# Patient Record
Sex: Female | Born: 1980 | Hispanic: Yes | Marital: Single | Smoking: Never smoker
Health system: Southern US, Community
[De-identification: ages and names within clinical notes are randomized; demographics above are authoritative.]

## PROBLEM LIST (undated history)

## (undated) ENCOUNTER — Inpatient Hospital Stay (HOSPITAL_COMMUNITY): Payer: Self-pay

## (undated) DIAGNOSIS — Z789 Other specified health status: Secondary | ICD-10-CM

## (undated) DIAGNOSIS — K219 Gastro-esophageal reflux disease without esophagitis: Secondary | ICD-10-CM

## (undated) DIAGNOSIS — Z8759 Personal history of other complications of pregnancy, childbirth and the puerperium: Secondary | ICD-10-CM

## (undated) DIAGNOSIS — Z8719 Personal history of other diseases of the digestive system: Secondary | ICD-10-CM

## (undated) HISTORY — DX: Personal history of other complications of pregnancy, childbirth and the puerperium: Z87.59

## (undated) HISTORY — DX: Personal history of other diseases of the digestive system: Z87.19

## (undated) HISTORY — PX: APPENDECTOMY: SHX54

---

## 2005-11-12 ENCOUNTER — Ambulatory Visit: Payer: Self-pay | Admitting: Gynecology

## 2005-11-13 ENCOUNTER — Ambulatory Visit (HOSPITAL_COMMUNITY): Admission: RE | Admit: 2005-11-13 | Discharge: 2005-11-13 | Payer: Self-pay | Admitting: *Deleted

## 2005-11-19 ENCOUNTER — Ambulatory Visit: Payer: Self-pay | Admitting: Obstetrics & Gynecology

## 2005-11-21 ENCOUNTER — Ambulatory Visit (HOSPITAL_COMMUNITY): Admission: RE | Admit: 2005-11-21 | Discharge: 2005-11-21 | Payer: Self-pay | Admitting: Obstetrics & Gynecology

## 2005-11-21 ENCOUNTER — Ambulatory Visit: Payer: Self-pay | Admitting: Obstetrics & Gynecology

## 2005-11-26 ENCOUNTER — Ambulatory Visit: Payer: Self-pay | Admitting: Obstetrics & Gynecology

## 2005-11-28 ENCOUNTER — Ambulatory Visit: Payer: Self-pay | Admitting: Obstetrics & Gynecology

## 2005-12-01 ENCOUNTER — Ambulatory Visit: Payer: Self-pay | Admitting: Obstetrics & Gynecology

## 2005-12-04 ENCOUNTER — Ambulatory Visit: Payer: Self-pay | Admitting: Gynecology

## 2005-12-08 ENCOUNTER — Ambulatory Visit: Payer: Self-pay | Admitting: Obstetrics & Gynecology

## 2005-12-11 ENCOUNTER — Ambulatory Visit: Payer: Self-pay | Admitting: Gynecology

## 2005-12-14 ENCOUNTER — Inpatient Hospital Stay (HOSPITAL_COMMUNITY): Admission: AD | Admit: 2005-12-14 | Discharge: 2005-12-17 | Payer: Self-pay | Admitting: Family Medicine

## 2005-12-14 ENCOUNTER — Ambulatory Visit: Payer: Self-pay | Admitting: Obstetrics and Gynecology

## 2013-06-30 HISTORY — PX: CHOLECYSTECTOMY: SHX55

## 2015-07-27 LAB — CYTOLOGY - PAP: Pap: NEGATIVE

## 2015-10-30 ENCOUNTER — Encounter (HOSPITAL_COMMUNITY): Payer: Self-pay | Admitting: Advanced Practice Midwife

## 2015-10-30 ENCOUNTER — Inpatient Hospital Stay (HOSPITAL_COMMUNITY)
Admission: AD | Admit: 2015-10-30 | Discharge: 2015-10-30 | Disposition: A | Discharge status: Home / Self Care | Payer: Self-pay | Source: Ambulatory Visit | Attending: Family Medicine | Admitting: Family Medicine

## 2015-10-30 ENCOUNTER — Inpatient Hospital Stay (HOSPITAL_COMMUNITY): Payer: Self-pay

## 2015-10-30 DIAGNOSIS — O26899 Other specified pregnancy related conditions, unspecified trimester: Secondary | ICD-10-CM

## 2015-10-30 DIAGNOSIS — O4691 Antepartum hemorrhage, unspecified, first trimester: Secondary | ICD-10-CM

## 2015-10-30 DIAGNOSIS — O209 Hemorrhage in early pregnancy, unspecified: Secondary | ICD-10-CM | POA: Insufficient documentation

## 2015-10-30 DIAGNOSIS — O9989 Other specified diseases and conditions complicating pregnancy, childbirth and the puerperium: Secondary | ICD-10-CM

## 2015-10-30 DIAGNOSIS — Z3A09 9 weeks gestation of pregnancy: Secondary | ICD-10-CM | POA: Insufficient documentation

## 2015-10-30 DIAGNOSIS — R109 Unspecified abdominal pain: Secondary | ICD-10-CM

## 2015-10-30 HISTORY — DX: Other specified health status: Z78.9

## 2015-10-30 LAB — URINALYSIS, ROUTINE W REFLEX MICROSCOPIC
BILIRUBIN URINE: NEGATIVE
GLUCOSE, UA: NEGATIVE mg/dL
KETONES UR: NEGATIVE mg/dL
Leukocytes, UA: NEGATIVE
Nitrite: NEGATIVE
PROTEIN: NEGATIVE mg/dL
Specific Gravity, Urine: <1.005 — ABNORMAL LOW (ref 1.005–1.030)
pH: 6 (ref 5.0–8.0)

## 2015-10-30 LAB — CBC
HCT: 38 % (ref 36.0–46.0)
Hemoglobin: 12.8 g/dL (ref 12.0–15.0)
MCH: 27.6 pg (ref 26.0–34.0)
MCHC: 33.7 g/dL (ref 30.0–36.0)
MCV: 81.9 fL (ref 78.0–100.0)
PLATELETS: 357 10*3/uL (ref 150–400)
RBC: 4.64 MIL/uL (ref 3.87–5.11)
RDW: 13.4 % (ref 11.5–15.5)
WBC: 9.4 10*3/uL (ref 4.0–10.5)

## 2015-10-30 LAB — WET PREP, GENITAL
Clue Cells Wet Prep HPF POC: NONE SEEN
Sperm: NONE SEEN
Trich, Wet Prep: NONE SEEN
YEAST WET PREP: NONE SEEN

## 2015-10-30 LAB — HCG, QUANTITATIVE, PREGNANCY: hCG, Beta Chain, Quant, S: 19598 m[IU]/mL — ABNORMAL HIGH (ref <5)

## 2015-10-30 LAB — URINE MICROSCOPIC-ADD ON: WBC UA: NONE SEEN WBC/hpf (ref 0–5)

## 2015-10-30 LAB — ABO/RH: ABO/RH(D): O POS

## 2015-10-30 LAB — POCT PREGNANCY, URINE: PREG TEST UR: POSITIVE — AB

## 2015-10-30 NOTE — MAU Note (Signed)
Had some mucous d/c mixed with blood, first noted on Friday. Last night she started having bleeding.this morning, her hips started hurting

## 2015-10-30 NOTE — MAU Provider Note (Signed)
History     CSN: [id number]  Arrival date and time: 10/30/15 1503   None     Chief Complaint  Patient presents with  . Hip Pain  . Vaginal Bleeding   HPI Pt is [redacted]w[redacted]d pregnant G1P0 presents with bleeding in pregnancy.  Pt had blood tinged mucous discharge yesterday and then had bleeding last night and this morning with hips hurting, like when she has a period. Pt has not taken anything for the pain.  Pt denies other abnormal discharge, abd cramping, nausea or vomiting, constipation, diarrhea or UTI sx. RN note:      Expand All Collapse All   Had some mucous d/c mixed with blood, first noted on Friday. Last night she started having bleeding.this morning, her hips started hurting       No past medical history on file.  No past surgical history on file.  No family history on file.  Social History  Substance Use Topics  . Smoking status: Not on file  . Smokeless tobacco: Not on file  . Alcohol Use: Not on file    Allergies: Allergies not on file  No prescriptions prior to admission    Review of Systems  Constitutional: Negative for fever and chills.  Gastrointestinal: Negative for nausea, vomiting, abdominal pain, diarrhea and constipation.  Genitourinary: Negative for dysuria.   Physical Exam   Blood pressure 106/73, pulse 69, temperature 98.2 F (36.8 C), temperature source Oral, resp. rate 18, height 5\' 4"  (1.626 m), weight 166 lb 9.6 oz (75.569 kg), last menstrual period 08/24/2015.  Physical Exam  Nursing note and vitals reviewed. Constitutional: She is oriented to person, place, and time. She appears well-developed and well-nourished. No distress.  HENT:  Head: Normocephalic.  Eyes: Pupils are equal, round, and reactive to light.  Neck: Normal range of motion. Neck supple.  Respiratory: Effort normal.  GI: Soft. She exhibits no distension. There is no tenderness. There is no rebound.  Genitourinary:  Small amount of blood tinged mucous type discharge in  vault; cervix closed, NT; uterus NSSC, NT; adnexa without palpable enlargement or tenderness  Musculoskeletal: Normal range of motion.  Neurological: She is alert and oriented to person, place, and time.  Skin: Skin is warm and dry.  Psychiatric: She has a normal mood and affect.    MAU Course  Procedures Results for orders placed or performed during the hospital encounter of 10/30/15 (from the past 24 hour(s))  Urinalysis, Routine w reflex microscopic (not at [facility])     Status: Abnormal   Collection Time: 10/30/15  3:30 PM  Result Value Ref Range   Color, Urine YELLOW YELLOW   APPearance CLEAR CLEAR   Specific Gravity, Urine <1.005 (L) 1.005 - 1.030   pH 6.0 5.0 - 8.0   Glucose, UA NEGATIVE NEGATIVE mg/dL   Hgb urine dipstick SMALL (A) NEGATIVE   Bilirubin Urine NEGATIVE NEGATIVE   Ketones, ur NEGATIVE NEGATIVE mg/dL   Protein, ur NEGATIVE NEGATIVE mg/dL   Nitrite NEGATIVE NEGATIVE   Leukocytes, UA NEGATIVE NEGATIVE  Urine microscopic-add on     Status: Abnormal   Collection Time: 10/30/15  3:30 PM  Result Value Ref Range   Squamous Epithelial / LPF 0-5 (A) NONE SEEN   WBC, UA NONE SEEN 0 - 5 WBC/hpf   RBC / HPF 0-5 0 - 5 RBC/hpf   Bacteria, UA RARE (A) NONE SEEN  Pregnancy, urine POC     Status: Abnormal   Collection Time: 10/30/15  3:55 PM  Result Value Ref Range   Preg Test, Ur POSITIVE (A) NEGATIVE  Wet prep, genital     Status: Abnormal   Collection Time: 10/30/15  4:15 PM  Result Value Ref Range   Yeast Wet Prep HPF POC NONE SEEN NONE SEEN   Trich, Wet Prep NONE SEEN NONE SEEN   Clue Cells Wet Prep HPF POC NONE SEEN NONE SEEN   WBC, Wet Prep HPF POC MODERATE (A) NONE SEEN   Sperm NONE SEEN   hCG, quantitative, pregnancy     Status: Abnormal   Collection Time: 10/30/15  4:30 PM  Result Value Ref Range   hCG, Beta Chain, Quant, S 19598 (H) <5 mIU/mL  CBC     Status: None   Collection Time: 10/30/15  4:30 PM  Result Value Ref Range   WBC 9.4 4.0 - 10.5 K/uL   RBC  4.64 3.87 - 5.11 MIL/uL   Hemoglobin 12.8 12.0 - 15.0 g/dL   HCT 16.1 09.6 - 04.5 %   MCV 81.9 78.0 - 100.0 fL   MCH 27.6 26.0 - 34.0 pg   MCHC 33.7 30.0 - 36.0 g/dL   RDW 40.9 81.1 - 91.4 %   Platelets 357 150 - 400 K/uL  ABO/Rh     Status: None (Preliminary result)   Collection Time: 10/30/15  4:30 PM  Result Value Ref Range   ABO/RH(D) O POS   US Ob Comp Less 14 Wks  10/30/2015  CLINICAL DATA:  Vaginal bleeding for 4 days.  Pain for 2 days. EXAM: OBSTETRIC <14 WK [place] AND TRANSVAGINAL OB US TECHNIQUE: Both transabdominal and transvaginal ultrasound examinations were performed for complete evaluation of the gestation as well as the maternal uterus, adnexal regions, and pelvic cul-de-sac. Transvaginal technique was performed to assess early pregnancy. COMPARISON:  None. FINDINGS: Intrauterine gestational sac: Yes Yolk sac:  Yes Embryo:  No Cardiac Activity: No Heart Rate:   bpm MSD: 18.2  mm   6 w   5  d CRL:    mm    w    d                  [place] EDC: Subchorionic hemorrhage:  None visualized. Maternal uterus/adnexae: Normal ovaries.  Trace free pelvic fluid IMPRESSION: Single intrauterine gestational sac with visible yolk sac but no fetal pole or cardiac activity yet visualized. Recommend follow-up quantitative B-HCG levels and follow-up US in 14 days to assess viability. This recommendation follows SRU consensus guidelines: Diagnostic Criteria for Nonviable Pregnancy Early in the First Trimester. Malva Limes Med 2013; 782:9562-13. Electronically Signed   By: Ellery Plunk M.D.   On: 10/30/2015 18:36   US Ob Transvaginal  10/30/2015  CLINICAL DATA:  Vaginal bleeding for 4 days.  Pain for 2 days. EXAM: OBSTETRIC <14 WK [place] AND TRANSVAGINAL OB US TECHNIQUE: Both transabdominal and transvaginal ultrasound examinations were performed for complete evaluation of the gestation as well as the maternal uterus, adnexal regions, and pelvic cul-de-sac. Transvaginal technique was performed to assess early pregnancy.  COMPARISON:  None. FINDINGS: Intrauterine gestational sac: Yes Yolk sac:  Yes Embryo:  No Cardiac Activity: No Heart Rate:   bpm MSD: 18.2  mm   6 w   5  d CRL:    mm    w    d                  [place] EDC: Subchorionic hemorrhage:  None visualized. Maternal uterus/adnexae: Normal ovaries.  Trace free pelvic fluid IMPRESSION: Single intrauterine gestational sac with visible yolk sac but no fetal pole or cardiac activity yet visualized. Recommend follow-up quantitative B-HCG levels and follow-up US in 14 days to assess viability. This recommendation follows SRU consensus guidelines: Diagnostic Criteria for Nonviable Pregnancy Early in the First Trimester. Malva Limes Engl J Med 2013; 696:2952-84; 369:1443-51. Electronically Signed   By: Ellery Plunkaniel R Mitchell M.D.   On: 10/30/2015 18:36  GC/chlamydia pending   Assessment and Plan  Vaginal bleeding in pregnancy US showed smaller than dates: IUGS with  YS; no embryo or CA F/u 2 weeks for viability scan- order submitted[facility] Pt to return sooner for increase in pain or bleeding Calix Heinbaugh 10/30/2015, 4:32 PM

## 2015-10-30 NOTE — Discharge Instructions (Signed)

## 2015-10-31 LAB — GC/CHLAMYDIA PROBE AMP (~~LOC~~) NOT AT ARMC
Chlamydia: NEGATIVE
Neisseria Gonorrhea: NEGATIVE

## 2015-10-31 LAB — HIV ANTIBODY (ROUTINE TESTING W REFLEX): HIV Screen 4th Generation wRfx: NONREACTIVE

## 2015-11-05 ENCOUNTER — Telehealth: Payer: Self-pay | Admitting: General Practice

## 2015-11-05 NOTE — Telephone Encounter (Signed)
Patient needs viability scan. Scheduled for 5/16 @ 9am. Needs f/u appt in clinics for results. Called patient and informed her of appt. Patient verbalized understanding and had no questions

## 2015-11-06 ENCOUNTER — Encounter (HOSPITAL_COMMUNITY): Payer: Self-pay | Admitting: *Deleted

## 2015-11-06 ENCOUNTER — Inpatient Hospital Stay (HOSPITAL_COMMUNITY): Payer: Self-pay

## 2015-11-06 ENCOUNTER — Inpatient Hospital Stay (HOSPITAL_COMMUNITY)
Admission: AD | Admit: 2015-11-06 | Discharge: 2015-11-06 | Disposition: A | Discharge status: Home / Self Care | Payer: Self-pay | Source: Ambulatory Visit | Attending: Obstetrics & Gynecology | Admitting: Obstetrics & Gynecology

## 2015-11-06 DIAGNOSIS — Z3A01 Less than 8 weeks gestation of pregnancy: Secondary | ICD-10-CM | POA: Insufficient documentation

## 2015-11-06 DIAGNOSIS — Z9049 Acquired absence of other specified parts of digestive tract: Secondary | ICD-10-CM | POA: Insufficient documentation

## 2015-11-06 DIAGNOSIS — O039 Complete or unspecified spontaneous abortion without complication: Secondary | ICD-10-CM

## 2015-11-06 DIAGNOSIS — O209 Hemorrhage in early pregnancy, unspecified: Secondary | ICD-10-CM | POA: Insufficient documentation

## 2015-11-06 LAB — CBC
HEMATOCRIT: 36.7 % (ref 36.0–46.0)
Hemoglobin: 12.5 g/dL (ref 12.0–15.0)
MCH: 27.7 pg (ref 26.0–34.0)
MCHC: 34.1 g/dL (ref 30.0–36.0)
MCV: 81.2 fL (ref 78.0–100.0)
Platelets: 344 10*3/uL (ref 150–400)
RBC: 4.52 MIL/uL (ref 3.87–5.11)
RDW: 13.2 % (ref 11.5–15.5)
WBC: 11.6 10*3/uL — AB (ref 4.0–10.5)

## 2015-11-06 LAB — HCG, QUANTITATIVE, PREGNANCY: hCG, Beta Chain, Quant, S: 7934 m[IU]/mL — ABNORMAL HIGH (ref <5)

## 2015-11-06 MED ORDER — SODIUM CHLORIDE 0.9 % IV SOLN
INTRAVENOUS | Status: DC
  Administered 2015-11-06: 17:00:00 via INTRAVENOUS

## 2015-11-06 MED ORDER — HYDROMORPHONE HCL 1 MG/ML IJ SOLN
1.0000 mg | Freq: Once | INTRAMUSCULAR | Status: AC
  Administered 2015-11-06: 1 mg via INTRAMUSCULAR
  Filled 2015-11-06: qty 1

## 2015-11-06 MED ORDER — GI COCKTAIL ~~LOC~~
30.0000 mL | Freq: Once | ORAL | Status: AC
  Administered 2015-11-06: 30 mL via ORAL
  Filled 2015-11-06: qty 30

## 2015-11-06 MED ORDER — IBUPROFEN 400 MG PO TABS: 400.0000 mg | ORAL_TABLET | Freq: Four times a day (QID) | ORAL | Status: DC | PRN

## 2015-11-06 MED ORDER — OXYCODONE-ACETAMINOPHEN 5-325 MG PO TABS: 1.0000 | ORAL_TABLET | Freq: Four times a day (QID) | ORAL | Status: DC | PRN

## 2015-11-06 NOTE — Discharge Instructions (Signed)
Aborto espontáneo  °(Miscarriage) °El aborto espontáneo es la pérdida de un bebé que no ha nacido (feto) antes de la semana 20 del embarazo. La mayor parte de estos abortos ocurre en los primeros 3 meses. En algunos casos ocurre antes de que la mujer sepa que está embarazada. También se denomina "aborto espontáneo" o "pérdida prematura del embarazo". El aborto espontáneo puede ser una experiencia que afecte emocionalmente a la persona. Converse con su médico si tiene dudas, cómo es el proceso de duelo, y sobre planes futuros de embarazo.  °CAUSAS  °· Algunos problemas cromosómicos pueden hacer imposible que el bebé se desarrolle normalmente. Los problemas con los genes o cromosomas del bebé son generalmente el resultado de errores que se producen, por casualidad, cuando el embrión se divide y crece. Estos problemas no se heredan de los padres. °· Infección en el cuello del útero.   °· Problemas hormonales.   °· Problemas en el cuello del útero, como tener un útero incompetente. Esto ocurre cuando los tejidos no son lo suficientemente fuertes como para contener el embarazo.   °· Problemas del útero, como un útero con forma anormal, los fibromas o anormalidades congénitas.   °· Ciertas enfermedades crónicas.   °· No fume, no beba alcohol, ni consuma drogas.   °· Traumatismos   °A veces, la causa es desconocida.  °SÍNTOMAS  °· Sangrado o manchado vaginal, con o sin cólicos o dolor. °· Dolor o cólicos en el abdomen o en la cintura. °· Eliminación de líquido, tejidos o coágulos grandes por la vagina. °DIAGNÓSTICO  °El médico le hará un examen físico. También le indicará una ecografía para confirmar el aborto. Es posible que se realicen análisis de sangre.  °TRATAMIENTO  °· En algunos casos el tratamiento no es necesario, si se eliminan naturalmente todos los tejidos embrionarios que se encontraban en el útero. Si el feto o la placenta quedan dentro del útero (aborto incompleto), pueden infectarse, los tejidos que quedan  pueden infectarse y deben retirarse. Generalmente se realiza un procedimiento de dilatación y curetaje (D y C). Durante el procedimiento de dilatación y curetaje, el cuello del útero se abre (dilata) y se retira cualquier resto de tejido fetal o placentario del útero. °· Si hay una infección, le recetarán antibióticos. Podrán recetarle otros medicamentos para reducir el tamaño del útero (contraerlo) si hay una mucho sangrado. °· Si su sangre es Rh negativa y su bebé es Rh positivo, usted necesitará la inyección de inmunoglobulina Rh. Esta inyección protegerá a los futuros bebés de tener problemas de compatibilidad Rh en futuros embarazos. °INSTRUCCIONES PARA EL CUIDADO EN EL HOGAR  °· El médico le indicará reposo en cama o le permitirá realizar actividades livianas. Vuelva a la actividad lentamente o según las indicaciones de su médico. °· Pídale a alguien que la ayude con las responsabilidades familiares y del hogar durante este tiempo.   °· Lleve un registro de la cantidad y la saturación de las toallas higiénicas que utiliza cada día. Anote esta información   °· No use tampones. No No se haga duchas vaginales ni tenga relaciones sexuales hasta que el médico la autorice.   °· Sólo tome medicamentos de venta libre o recetados para calmar el dolor o el malestar, según las indicaciones de su médico.   °· No tome aspirina. La aspirina puede ocasionar hemorragias.   °· Concurra puntualmente a las citas de control con el médico.   °· Si usted o su pareja tienen dificultades con el duelo, hable con su médico para buscar la ayuda psicológica que los ayude a enfrentar la pérdida   del embarazo. Permítase el tiempo suficiente de duelo antes de quedar embarazada nuevamente.   °SOLICITE ATENCIÓN MÉDICA DE INMEDIATO SI:  °· Siente calambres intensos o dolor en la espalda o en el abdomen. °· Tiene fiebre. °· Elimina grandes coágulos de sangre (del tamaño de una nuez o más) o tejidos por la vagina. Guarde lo que ha eliminado para  que su médico lo examine.   °· La hemorragia aumenta.   °· Observa una secreción vaginal espesa y con mal olor. °· Se siente mareada, débil, o se desmaya.   °· Siente escalofríos.   °ASEGÚRESE DE QUE:  °· Comprende estas instrucciones. °· Controlará su enfermedad. °· Solicitará ayuda de inmediato si no mejora o si empeora. °  °Esta información no tiene como fin reemplazar el consejo del médico. Asegúrese de hacerle al médico cualquier pregunta que tenga. °  °Document Released: 03/26/2005 Document Revised: 10/11/2012 °Elsevier Interactive Patient Education ©2016 Elsevier Inc. ° °Reposo pélvico  °(Pelvic Rest) °El reposo pélvico se recomienda a las mujeres cuando:  °· La placenta cubre parcial o completamente la abertura del cuello del útero (placenta previa). °· Hay sangrado entre la pared del útero y el saco amniótico en el primer trimestre (hemorragia subcoriónica). °· El cuello uterino comienza a abrirse sin iniciarse el trabajo de parto (cuello uterino incompetente, insuficiencia cervical). °· El trabajo de parto se inicia muy pronto (parto prematuro). °INSTRUCCIONES PARA EL CUIDADO EN EL HOGAR  °· No tenga relaciones sexuales, estimulación, ni orgasmos. °· No use tampones, no se haga duchas vaginales ni coloque ningún objeto en la vagina. °· No levante objetos que pesen más de 10 libras (4,5 kg). °· Evite las actividades extenuantes o tensionar los músculos de la pelvis. °SOLICITE ATENCIÓN MÉDICA SI:   °· Tiene un sangrado vaginal durante el embarazo. Considérelo como una posible emergencia. °· Siente cólicos en la zona baja del estómago (más fuertes que los cólicos menstruales). °· Nota flujo vaginal (acuoso, con moco o sangre). °· Siente un dolor en la espalda leve y sordo. °· Tiene contracciones regulares o endurecimiento del útero. °SOLICITE ATENCIÓN MÉDICA DE INMEDIATO SI:  °Observa sangrado vaginal y tiene placenta previa.  °  °Esta información no tiene como fin reemplazar el consejo del médico. Asegúrese  de hacerle al médico cualquier pregunta que tenga. °  °Document Released: 03/10/2012 °Elsevier Interactive Patient Education ©2016 Elsevier Inc. ° °

## 2015-11-06 NOTE — MAU Provider Note (Signed)
History     CSN: [id number]  Arrival date and time: 11/06/15 1433   First Provider Initiated Contact with Patient 11/06/15 1530      Chief Complaint  Patient presents with  . Vaginal Bleeding   HPI Pt is 7113w5d pregnant [id number] who presents with heavy vaginal bleeding in pregnancy. Pt was seen on 10/30/2015 with spotting in pregnancy- US showed IUGS with YS no fetal pole or CA Measuring 7213w5d.  Pt states her pain was stong this morning and has progressively gotten worse throughout the day.  Bleeding started Saturday but became heavy today and soaked through her clothes. Pt has not taken anything for the pain.  Pt denies fever, chills, constipation, diarrhea or UTI sx. Pt threw up this morning but is not nauseated now.  RN note:     Expand All Collapse All   Pt reports pain this morning that has getting progressively stronger throughout then day. Bleeding started Saturday, but around lunch time today, she had a "big gush of blood that soaked through " her clothes.       Past Medical History  Diagnosis Date  . Medical history non-contributory     Past Surgical History  Procedure Laterality Date  . Cholecystectomy  2015    History reviewed. No pertinent family history.  Social History  Substance Use Topics  . Smoking status: Never Smoker   . Smokeless tobacco: None  . Alcohol Use: No    Allergies: No Known Allergies  Prescriptions prior to admission  Medication Sig Dispense Refill Last Dose  . Prenatal Vit-Fe Fumarate-FA (PRENATAL MULTIVITAMIN) TABS tablet Take 1 tablet by mouth daily at 12 noon.   Past Week at Unknown time    Review of Systems  Constitutional: Negative for fever and chills.  Gastrointestinal: Positive for abdominal pain. Negative for nausea, diarrhea and constipation.  Genitourinary: Negative for dysuria and urgency.  Neurological: Positive for dizziness.   Physical Exam   Blood pressure 106/74, pulse 82, temperature 98 F (36.7 C), resp. rate  16, last menstrual period 08/24/2015.  Physical Exam  Nursing note and vitals reviewed. Constitutional: She is oriented to person, place, and time. She appears well-developed and well-nourished.  Uncomfortable appearing  HENT:  Head: Normocephalic.  Eyes: Pupils are equal, round, and reactive to light.  Neck: Normal range of motion. Neck supple.  Cardiovascular: Normal rate and normal heart sounds.   Respiratory: Effort normal and breath sounds normal. No respiratory distress.  GI: Soft. She exhibits no distension. There is tenderness. There is no rebound.  Genitourinary:  Large amount of blood in vault.; ?gestational sac removed from cervix intact; cervix closed; bleeding controlled.  Musculoskeletal: Normal range of motion.  Neurological: She is alert and oriented to person, place, and time.  Skin: Skin is warm and dry.  Psychiatric: She has a normal mood and affect.    MAU Course  Procedures Results for orders placed or performed during the hospital encounter of 11/06/15 (from the past 24 hour(s))  CBC     Status: Abnormal   Collection Time: 11/06/15  2:48 PM  Result Value Ref Range   WBC 11.6 (H) 4.0 - 10.5 K/uL   RBC 4.52 3.87 - 5.11 MIL/uL   Hemoglobin 12.5 12.0 - 15.0 g/dL   HCT 13.036.7 86.536.0 - 78.446.0 %   MCV 81.2 78.0 - 100.0 fL   MCH 27.7 26.0 - 34.0 pg   MCHC 34.1 30.0 - 36.0 g/dL   RDW 69.613.2 29.511.5 - 28.415.5 %  Platelets 344 150 - 400 K/uL  hCG, quantitative, pregnancy     Status: Abnormal   Collection Time: 11/06/15  2:48 PM  Result Value Ref Range   hCG, Beta Chain, Quant, S 7934 (H) <5 mIU/mL  Results for CAELYNN, MARSHMAN (MRN [id number]) as of 11/06/2015 17:28  Ref. Range 10/30/2015 16:30 10/30/2015 18:31 11/06/2015 14:48 11/06/2015 16:59  HCG, Beta Chain, Quant, S Latest Ref Range: <5 mIU/mL 19598 (H)  7934 (H)   US Ob Transvaginal  11/06/2015  CLINICAL DATA:  35 year old pregnant female with intrauterine gestational sac with yolk sac and no embryo on obstetric scan 7  days prior, now with 2 days of vaginal bleeding and quantitative beta HCG 7,934, reportedly decreased since 10/30/2015. EDC by LMP: 05/30/2016, projecting to an expected gestational age of [redacted] weeks 4 days. EXAM: TRANSVAGINAL OB ULTRASOUND TECHNIQUE: Transvaginal ultrasound was performed for complete evaluation of the gestation as well as the maternal uterus, adnexal regions, and pelvic cul-de-sac. COMPARISON:  10/30/2015 obstetric scan. FINDINGS: The anteverted anteflexed uterus measures 10.6 x 5.9 x 6.2 cm. No uterine fibroids or other myometrial abnormalities. The bilayer endometrial thickness is 19 mm. The endometrium is diffusely heterogeneous in echogenicity. There is no gestational sac within the endometrium or endocervical canal. No focal endometrial mass. Right ovary measures 3.3 x 1.9 x 2.6 cm. Left ovary measures 2.8 x 1.2 x 1.8 cm. No ovarian or adnexal masses. No abnormal free fluid in the pelvis. IMPRESSION: 1. Diffusely thickened (19 mm) and heterogeneous endometrium. No residual gestational sac in the uterus or cervix. Findings are in keeping with spontaneous abortion in progress with endometrial blood products. If the patient's bleeding persists, a follow-up pelvic sonogram could be obtained to assess for retained products, as clinically warranted. 2. Normal ovaries.  No adnexal masses. Electronically Signed   By: Delbert Phenix M.D.   On: 11/06/2015 17:12   Reviewed ultrasound findings with pt and partner Pt started having left chest pain and difficulty taking breath O2 started on pt and EKG and IVF ANS ordered Discussed with Dr. Macon Large- pt is otherwise healthy young female Heart RRR and lungs clear to auscultation Interpreter and nurse with pt Care transferred to Wynelle Bourgeois, CNM  Assessment and Plan  SAB- f/u with [facility] in 2 weeks  LINEBERRY,SUSAN 11/06/2015, 3:31 PM   EKG normal sinus rhythm, verified with Dr Macon Large.  There is mention of LA enlargement, She states probably normal  variant of pregnancy  WIll give a GI cocktail to see if that helps her pressure  US Ob Comp Less 14 Wks  10/30/2015  CLINICAL DATA:  Vaginal bleeding for 4 days.  Pain for 2 days. EXAM: OBSTETRIC <14 WK [place] AND TRANSVAGINAL OB US TECHNIQUE: Both transabdominal and transvaginal ultrasound examinations were performed for complete evaluation of the gestation as well as the maternal uterus, adnexal regions, and pelvic cul-de-sac. Transvaginal technique was performed to assess early pregnancy. COMPARISON:  None. FINDINGS: Intrauterine gestational sac: Yes Yolk sac:  Yes Embryo:  No Cardiac Activity: No Heart Rate:   bpm MSD: 18.2  mm   6 w   5  d CRL:    mm    w    d                  [place] EDC: Subchorionic hemorrhage:  None visualized. Maternal uterus/adnexae: Normal ovaries.  Trace free pelvic fluid IMPRESSION: Single intrauterine gestational sac with visible yolk sac but no fetal pole or cardiac activity yet visualized.  Recommend follow-up quantitative B-HCG levels and follow-up US in 14 days to assess viability. This recommendation follows SRU consensus guidelines: Diagnostic Criteria for Nonviable Pregnancy Early in the First Trimester. Malva Limes Med 2013; 161:0960-45. Electronically Signed   By: Ellery Plunk M.D.   On: 10/30/2015 18:36   US Ob Transvaginal  11/06/2015  CLINICAL DATA:  35 year old pregnant female with intrauterine gestational sac with yolk sac and no embryo on obstetric scan 7 days prior, now with 2 days of vaginal bleeding and quantitative beta HCG 7,934, reportedly decreased since 10/30/2015. EDC by LMP: 05/30/2016, projecting to an expected gestational age of [redacted] weeks 4 days. EXAM: TRANSVAGINAL OB ULTRASOUND TECHNIQUE: Transvaginal ultrasound was performed for complete evaluation of the gestation as well as the maternal uterus, adnexal regions, and pelvic cul-de-sac. COMPARISON:  10/30/2015 obstetric scan. FINDINGS: The anteverted anteflexed uterus measures 10.6 x 5.9 x 6.2 cm. No uterine  fibroids or other myometrial abnormalities. The bilayer endometrial thickness is 19 mm. The endometrium is diffusely heterogeneous in echogenicity. There is no gestational sac within the endometrium or endocervical canal. No focal endometrial mass. Right ovary measures 3.3 x 1.9 x 2.6 cm. Left ovary measures 2.8 x 1.2 x 1.8 cm. No ovarian or adnexal masses. No abnormal free fluid in the pelvis. IMPRESSION: 1. Diffusely thickened (19 mm) and heterogeneous endometrium. No residual gestational sac in the uterus or cervix. Findings are in keeping with spontaneous abortion in progress with endometrial blood products. If the patient's bleeding persists, a follow-up pelvic sonogram could be obtained to assess for retained products, as clinically warranted. 2. Normal ovaries.  No adnexal masses. Electronically Signed   By: Delbert Phenix M.D.   On: 11/06/2015 17:12   US Ob Transvaginal  10/30/2015  CLINICAL DATA:  Vaginal bleeding for 4 days.  Pain for 2 days. EXAM: OBSTETRIC <14 WK [place] AND TRANSVAGINAL OB US TECHNIQUE: Both transabdominal and transvaginal ultrasound examinations were performed for complete evaluation of the gestation as well as the maternal uterus, adnexal regions, and pelvic cul-de-sac. Transvaginal technique was performed to assess early pregnancy. COMPARISON:  None. FINDINGS: Intrauterine gestational sac: Yes Yolk sac:  Yes Embryo:  No Cardiac Activity: No Heart Rate:   bpm MSD: 18.2  mm   6 w   5  d CRL:    mm    w    d                  [place] EDC: Subchorionic hemorrhage:  None visualized. Maternal uterus/adnexae: Normal ovaries.  Trace free pelvic fluid IMPRESSION: Single intrauterine gestational sac with visible yolk sac but no fetal pole or cardiac activity yet visualized. Recommend follow-up quantitative B-HCG levels and follow-up US in 14 days to assess viability. This recommendation follows SRU consensus guidelines: Diagnostic Criteria for Nonviable Pregnancy Early in the First Trimester. Malva Limes  Med 2013; 409:8119-14. Electronically Signed   By: Ellery Plunk M.D.   On: 10/30/2015 18:36    US showed normal endometrium c/w SAB.  Would repeat if bleeding persists.  Plan followup in clinic next week. Has [place] sched next week, will change to Post SAB appt  Aviva Signs, CNM

## 2015-11-06 NOTE — MAU Note (Signed)
Pt reports pain this morning that has getting progressively stronger throughout then day. Bleeding started Saturday, but around lunch time today, she had a "big gush of blood that soaked through " her clothes.

## 2015-11-13 ENCOUNTER — Ambulatory Visit (INDEPENDENT_AMBULATORY_CARE_PROVIDER_SITE_OTHER): Payer: Self-pay | Admitting: Advanced Practice Midwife

## 2015-11-13 ENCOUNTER — Ambulatory Visit (HOSPITAL_COMMUNITY)
Admission: RE | Admit: 2015-11-13 | Discharge: 2015-11-13 | Disposition: A | Discharge status: Home / Self Care | Payer: Self-pay | Source: Ambulatory Visit | Attending: Gynecology | Admitting: Gynecology

## 2015-11-13 ENCOUNTER — Encounter: Payer: Self-pay | Admitting: Family

## 2015-11-13 VITALS — BP 103/65 | HR 69 | Temp 98.0°F | Wt 166.4 lb

## 2015-11-13 DIAGNOSIS — N939 Abnormal uterine and vaginal bleeding, unspecified: Secondary | ICD-10-CM | POA: Insufficient documentation

## 2015-11-13 DIAGNOSIS — O209 Hemorrhage in early pregnancy, unspecified: Secondary | ICD-10-CM

## 2015-11-13 DIAGNOSIS — O039 Complete or unspecified spontaneous abortion without complication: Secondary | ICD-10-CM

## 2015-11-13 NOTE — Progress Notes (Signed)
Subjective:     Patient ID: Rebecca Ibarra, female   DOB: 07-Oct-1980, 35 y.o.   MRN: [id number]  HPI: Here for F/U after SAB and to review [place] results. Was seen in [facility] 11/06/15 and Dx'd w/ SAB. IUP confirmed at previous [facility] visit.   Does not desire pregnancy at this time. Last Pap normal at [facility] 07/2015.    Review of Systems  Constitutional: Neg for feer, chills. GI: Neg for abd pain GU: light pink vaginal bleeding     Objective:   Physical Exam  BP 103/65 mmHg  Pulse 69  Temp(Src) 98 F (36.7 C)  Wt 166 lb 6.4 oz (75.479 kg)  LMP 08/24/2015  Breastfeeding No Constitutional: NAD Heart: Regular rate Lungs: Regular rate, normal effort Abd: Soft , NT GU: Pelvic exam deferred  US Ob Comp Less 14 Wks  10/30/2015  CLINICAL DATA:  Vaginal bleeding for 4 days.  Pain for 2 days. EXAM: OBSTETRIC <14 WK [place] AND TRANSVAGINAL OB US TECHNIQUE: Both transabdominal and transvaginal ultrasound examinations were performed for complete evaluation of the gestation as well as the maternal uterus, adnexal regions, and pelvic cul-de-sac. Transvaginal technique was performed to assess early pregnancy. COMPARISON:  None. FINDINGS: Intrauterine gestational sac: Yes Yolk sac:  Yes Embryo:  No Cardiac Activity: No Heart Rate:   bpm MSD: 18.2  mm   6 w   5  d CRL:    mm    w    d                  [place] EDC: Subchorionic hemorrhage:  None visualized. Maternal uterus/adnexae: Normal ovaries.  Trace free pelvic fluid IMPRESSION: Single intrauterine gestational sac with visible yolk sac but no fetal pole or cardiac activity yet visualized. Recommend follow-up quantitative B-HCG levels and follow-up US in 14 days to assess viability. This recommendation follows SRU consensus guidelines: Diagnostic Criteria for Nonviable Pregnancy Early in the First Trimester. Malva Limes Med 2013; 782:9562-13. Electronically Signed   By: Ellery Plunk M.D.   On: 10/30/2015 18:36   US Ob Transvaginal  11/13/2015  CLINICAL DATA:  Vaginal  bleeding.  Pregnancy. EXAM: OBSTETRIC <14 WK [place] AND TRANSVAGINAL OB US TECHNIQUE: Both transabdominal and transvaginal ultrasound examinations were performed for complete evaluation of the gestation as well as the maternal uterus, adnexal regions, and pelvic cul-de-sac. Transvaginal technique was performed to assess early pregnancy. COMPARISON:  11/06/2015. FINDINGS: Intrauterine gestational sac: None visualized. Yolk sac:  None visualized. Embryo:  None visualized. Cardiac Activity: None visualized. Subchorionic hemorrhage:  None visualized. Maternal uterus/adnexae: Endometrium remains heterogeneously thickened at 19 mm. This could be related to recent pregnancy. Retained products of conception cannot be completely excluded. IMPRESSION: Endometrium remains heterogeneous and thickened at 19 mm. This could be related to recent pregnancy P retained products of conception cannot be completely excluded and continued follow-up exam suggested. Electronically Signed   By: Maisie Fus  Register   On: 11/13/2015 09:46   US Ob Transvaginal  11/06/2015  CLINICAL DATA:  35 year old pregnant female with intrauterine gestational sac with yolk sac and no embryo on obstetric scan 7 days prior, now with 2 days of vaginal bleeding and quantitative beta HCG 7,934, reportedly decreased since 10/30/2015. EDC by LMP: 05/30/2016, projecting to an expected gestational age of [redacted] weeks 4 days. EXAM: TRANSVAGINAL OB ULTRASOUND TECHNIQUE: Transvaginal ultrasound was performed for complete evaluation of the gestation as well as the maternal uterus, adnexal regions, and pelvic cul-de-sac. COMPARISON:  10/30/2015 obstetric scan.  FINDINGS: The anteverted anteflexed uterus measures 10.6 x 5.9 x 6.2 cm. No uterine fibroids or other myometrial abnormalities. The bilayer endometrial thickness is 19 mm. The endometrium is diffusely heterogeneous in echogenicity. There is no gestational sac within the endometrium or endocervical canal. No focal endometrial  mass. Right ovary measures 3.3 x 1.9 x 2.6 cm. Left ovary measures 2.8 x 1.2 x 1.8 cm. No ovarian or adnexal masses. No abnormal free fluid in the pelvis. IMPRESSION: 1. Diffusely thickened (19 mm) and heterogeneous endometrium. No residual gestational sac in the uterus or cervix. Findings are in keeping with spontaneous abortion in progress with endometrial blood products. If the patient's bleeding persists, a follow-up pelvic sonogram could be obtained to assess for retained products, as clinically warranted. 2. Normal ovaries.  No adnexal masses. Electronically Signed   By: Delbert PhenixJason A Poff M.D.   On: 11/06/2015 17:12   [place] Ob Transvaginal  10/30/2015  CLINICAL DATA:  Vaginal bleeding for 4 days.  Pain for 2 days. EXAM: OBSTETRIC <14 WK US AND TRANSVAGINAL OB US TECHNIQUE: Both transabdominal and transvaginal ultrasound examinations were performed for complete evaluation of the gestation as well as the maternal uterus, adnexal regions, and pelvic cul-de-sac. Transvaginal technique was performed to assess early pregnancy. COMPARISON:  None. FINDINGS: Intrauterine gestational sac: Yes Yolk sac:  Yes Embryo:  No Cardiac Activity: No Heart Rate:   bpm MSD: 18.2  mm   6 w   5  d CRL:    mm    w    d                  US EDC: Subchorionic hemorrhage:  None visualized. Maternal uterus/adnexae: Normal ovaries.  Trace free pelvic fluid IMPRESSION: Single intrauterine gestational sac with visible yolk sac but no fetal pole or cardiac activity yet visualized. Recommend follow-up quantitative B-HCG levels and follow-up US in 14 days to assess viability. This recommendation follows SRU consensus guidelines: Diagnostic Criteria for Nonviable Pregnancy Early in the First Trimester. Malva Limes Engl J Med 2013; 161:0960-45; 369:1443-51. Electronically Signed   By: Ellery Plunkaniel R Mitchell M.D.   On: 10/30/2015 18:36       Assessment:     Completed SAB    Plan:     F/U PRN for heavy bleeding, fever, severe pain. Support given. Condoms for BC.  Contraceptive choices list given.   [place] Ki Luckman, CNM 11/13/2015 1:00 PM

## 2015-11-13 NOTE — Progress Notes (Signed)
Used Video interpreter Gershon Cullriscilla [phone]#[id number].

## 2015-11-13 NOTE — Patient Instructions (Signed)
Eleccin del mtodo anticonceptivo (Contraception Choices) La anticoncepcin (control de la natalidad) es el uso de cualquier mtodo o dispositivo para evitar el embarazo. A continuacin se indican algunos de esos mtodos. MTODOS HORMONALES   El Implante contraconceptivo consiste en un tubo plstico delgado que contiene la hormona progesterona. No contiene estrgenos. El mdico inserta el tubo en la parte interna del brazo. El tubo puede permanecer en el lugar durante 3 aos. Despus de los 3 aos debe retirarse. El implante impide que los ovarios liberen vulos (ovulacin), espesa el moco cervical, lo que evita que los espermatozoides ingresen al tero y hace ms delgada la membrana que cubre el interior del tero.  Inyecciones de progesterona sola: las administra el mdico cada 3 meses para evitar el embarazo. La progesterona sinttica impide que los ovarios liberen vulos. Tambin hacen que el moco cervical se espese y modifique el tejido de recubrimiento interno del tero. Esto hace ms difcil que los espermatozoides sobrevivan en el tero.  Las pldoras anticonceptivas contienen estrgenos y progesterona. Su funcin es evitar que los ovarios liberen vulos (ovulacin). Las hormonas de los anticonceptivos orales hacen que el moco cervical se haga ms espeso, lo que evita que el esperma ingrese al tero. Las pldoras anticonceptivas son recetadas por el mdico.Tambin se utilizan para tratar los perodos menstruales abundantes.  Minipldora: este tipo de pldora anticonceptiva contiene slo hormona progesterona. Deben tomarse todos los das del mes y debe recetarlas el mdico.  El parche de control de natalidad: contiene hormonas similares a las que contienen las pldoras anticonceptivas. Deben cambiarse una vez por semana y se utilizan bajo prescripcin mdica.  Anillo vaginal: contiene hormonas similares a las que contienen las pldoras anticonceptivas. Se deja colocado durante tres semanas,  se lo retira durante 1 semana y luego se coloca uno nuevo. La paciente debe sentirse cmoda al insertar y retirar el anillo de la vagina.Es necesaria la prescripcin mdica.  Anticonceptivos de emergencia: son mtodos para evitar un embarazo despus de una relacin sexual sin proteccin. Esta pldora puede tomarse inmediatamente despus de tener relaciones sexuales o hasta 5 das de haber tenido sexo sin proteccin. Es ms efectiva si se toma poco tiempo despus de la relacin sexual. Los anticonceptivos de emergencia estn disponibles sin prescripcin mdica. Consltelo con su farmacutico. No use los anticonceptivos de emergencia como nico mtodo anticonceptivo. MTODOS DE BARRERA   Condn masculino: es una vaina delgada (ltex o goma) que se coloca cubriendo al pene durante el acto sexual. Puede usarse con espermicida para aumentar la efectividad.  Condn femenino. Es una funda delicada y blanda que se adapta holgadamente a la vagina antes de las relaciones sexuales.  Diafragma: es una barrera de ltex redonda y suave que debe ser recomendado por un profesional. Se inserta en la vagina, junto con un gel espermicida. Debe insertarse antes de tener relaciones sexuales. Debe dejar el diafragma colocado en la vagina durante 6 a 8 horas despus de la relacin sexual.  Capuchn cervical: es una barrera de ltex o taza plstica redonda y suave que cubre el cuello del tero y debe ser colocada por un mdico. Puede dejarlo colocado en la vagina hasta 48 horas despus de las relaciones sexuales.  Esponja: es una pieza blanda y circular de espuma de poliuretano. Contiene un espermicida. Se inserta en la vagina despus de mojarla y antes de las relaciones sexuales.  Espermicidas: son sustancias qumicas que matan o bloquean al esperma y no lo dejan ingresar al cuello del tero y al tero. Vienen   en forma de cremas, geles, supositorios, espuma o comprimidos. No es necesario tener receta mdica. Se insertan en  la vagina con un aplicador antes de tener relaciones sexuales. El proceso debe repetirse cada vez que tiene relaciones sexuales. ANTICONCEPTIVOS INTRAUTERINOS  Dispositivo intrauterino (DIU) es un dispositivo en forma de T que se coloca en el tero durante el perodo menstrual, para evitar el embarazo. Hay dos tipos:  DIU de cobre: este tipo de DIU est recubierto con un alambre de cobre y se inserta dentro del tero. El cobre hace que el tero y las trompas de Falopio produzcan un liquido que destruye los espermatozoides. Puede permanecer colocado durante 10 aos.  DIU con hormona: este tipo de DIU contiene la hormona progestina (progesterona sinttica). La hormona espesa el moco cervical y evita que los espermatozoides ingresen al tero y tambin afina la membrana que cubre el tero para evitar la implantacin del vulo fertilizado. La hormona debilita o destruye los espermatozoides que ingresan al tero. Puede permanecer en el lugar durante 3-5 aos, segn el tipo de DIU que se utilice. MTODOS ANTICONCEPTIVOS PERMANENTES  Ligadura de trompas en la mujer: se realiza sellando, atando u obstruyendo quirrgicamente las trompas de Falopio lo que impide que el vulo descienda hacia el tero.  Esterilizacin histeroscpica: Implica la colocacin de un pequeo espiral o la insercin en cada trompa de Falopio. El mdico utiliza una tcnica llamada histeroscopa para realizar este procedimiento. El dispositivo produce la formacin de tejido cicatrizal. Esto da como resultado una obstruccin permanente de las trompas de Falopio, de modo que la esperma no pueda fertilizar el vulo. Demora alrededor de 3 meses despus del procedimiento hasta que el conducto se obstruye. Tendr que usar otro mtodo anticonceptivo durante al menos 3 meses.  Esterilizacin masculina: se realiza ligando los conductos por los que pasan los espermatozoides (vasectoma).Esto impide que el esperma ingrese a la vagina durante el acto  sexual. Luego del procedimiento, el hombre puede eyacular lquido (semen). MTODOS DE PLANIFICACIN NATURAL  Planificacin familiar natural: consiste en no tener relaciones sexuales o usar un mtodo de barrera (condn, diafragma, capuchn cervical) en los das que la mujer podra quedar embarazada.  Mtodo de calendario: consiste en el seguimiento de la duracin de cada ciclo menstrual y la identificacin de los perodos frtiles.  Mtodo de ovulacin: consiste en evitar las relaciones sexuales durante la ovulacin.  Mtodo sintotrmico: consiste en evitar las relaciones sexuales en la poca en la que se est ovulando, utilizando un termmetro y tendiendo en cuenta los sntomas de la ovulacin.  Mtodo postovulacin: consiste en planificar las relaciones sexuales para despus de haber ovulado. Independientemente del tipo o mtodo anticonceptivo que usted elija, es importante que use condones para protegerse contra las infecciones de transmisin sexual (ETS). Hable con su mdico con respecto a qu mtodo anticonceptivo es el ms apropiado para usted.   Esta informacin no tiene como fin reemplazar el consejo del mdico. Asegrese de hacerle al mdico cualquier pregunta que tenga.   Document Released: 06/16/2005 Document Revised: 02/16/2013 Elsevier Interactive Patient Education 2016 Elsevier Inc.  

## 2016-06-30 NOTE — L&D Delivery Note (Signed)
36 y.o. [id number] at 5667w2d delivered a viable female infant in cephalic, ROA position. Anterior shoulder delivered with ease. 60 sec delayed cord clamping. Cord clamped x2 and cut. Placenta delivered spontaneously intact, with 3VC. Fundus firm on exam with massage and pitocin. Good hemostasis noted.  Anesthesia: IV pain medications Laceration: None Good hemostasis noted. EBL: 150 cc  Mom and baby recovering in LDR.    Apgars: APGAR (1 MIN): 8   APGAR (5 MINS): 9   APGAR (10 MINS):   Weight: Pending skin to skin  Sponge and instrument count were correct x2. Placenta sent to L&D  Howard PouchLauren Feng, MD PGY-1 Family Medicine 12/20/2016, 2:38 AM  Patient is a [id number] at 6967w2d who was admitted for IOL due to cholestasis but otherwise uncomplicated prenatal course.  She progressed with augmentation via cytotec, foley, and AROM.  I was gloved and present for delivery in its entirety.  Second stage of labor progressed, baby progressed to SVD after pushing well.  No decels during second stage noted.  Complications: none  Lacerations: none  EBL: 150cc  Cam HaiSHAW, KIMBERLY, CNM 5:31 AM 12/20/2016

## 2016-07-23 ENCOUNTER — Other Ambulatory Visit: Payer: Self-pay

## 2016-07-23 ENCOUNTER — Other Ambulatory Visit (HOSPITAL_COMMUNITY): Payer: Self-pay | Admitting: Nurse Practitioner

## 2016-07-23 DIAGNOSIS — Z3A18 18 weeks gestation of pregnancy: Secondary | ICD-10-CM

## 2016-07-23 DIAGNOSIS — O09522 Supervision of elderly multigravida, second trimester: Secondary | ICD-10-CM

## 2016-07-23 DIAGNOSIS — Z3689 Encounter for other specified antenatal screening: Secondary | ICD-10-CM

## 2016-07-23 LAB — OB RESULTS CONSOLE GC/CHLAMYDIA
Chlamydia: NEGATIVE
Gonorrhea: NEGATIVE

## 2016-07-23 LAB — GLUCOSE TOLERANCE, 1 HOUR: Glucose, 1 Hour GTT: 81

## 2016-07-23 LAB — OB RESULTS CONSOLE PLATELET COUNT: Platelets: 359 10*3/uL

## 2016-07-23 LAB — OB RESULTS CONSOLE HEPATITIS B SURFACE ANTIGEN: HEP B S AG: NEGATIVE

## 2016-07-23 LAB — OB RESULTS CONSOLE ABO/RH: RH TYPE: POSITIVE

## 2016-07-23 LAB — OB RESULTS CONSOLE RUBELLA ANTIBODY, IGM: Rubella: IMMUNE

## 2016-07-23 LAB — OB RESULTS CONSOLE VARICELLA ZOSTER ANTIBODY, IGG: VARICELLA IGG: IMMUNE

## 2016-07-23 LAB — OB RESULTS CONSOLE HGB/HCT, BLOOD
HCT: 40 %
Hemoglobin: 12 g/dL

## 2016-07-23 LAB — OB RESULTS CONSOLE RPR: RPR: NONREACTIVE

## 2016-07-23 LAB — OB RESULTS CONSOLE ANTIBODY SCREEN: ANTIBODY SCREEN: NEGATIVE

## 2016-07-24 LAB — SICKLE CELL SCREEN: SICKLE CELL SCREEN: NORMAL

## 2016-07-24 LAB — OB RESULTS CONSOLE HIV ANTIBODY (ROUTINE TESTING): HIV: NONREACTIVE

## 2016-07-29 ENCOUNTER — Other Ambulatory Visit: Payer: Self-pay

## 2016-08-01 ENCOUNTER — Encounter: Payer: Self-pay | Admitting: *Deleted

## 2016-08-05 ENCOUNTER — Encounter: Payer: Self-pay | Admitting: Obstetrics & Gynecology

## 2016-08-05 ENCOUNTER — Ambulatory Visit (INDEPENDENT_AMBULATORY_CARE_PROVIDER_SITE_OTHER): Payer: Medicaid Other | Admitting: Obstetrics & Gynecology

## 2016-08-05 VITALS — BP 105/68 | HR 91 | Ht 62.0 in | Wt 167.3 lb

## 2016-08-05 DIAGNOSIS — Z113 Encounter for screening for infections with a predominantly sexual mode of transmission: Secondary | ICD-10-CM | POA: Diagnosis not present

## 2016-08-05 DIAGNOSIS — O09522 Supervision of elderly multigravida, second trimester: Secondary | ICD-10-CM | POA: Diagnosis not present

## 2016-08-05 DIAGNOSIS — Z3480 Encounter for supervision of other normal pregnancy, unspecified trimester: Secondary | ICD-10-CM

## 2016-08-05 DIAGNOSIS — O09529 Supervision of elderly multigravida, unspecified trimester: Secondary | ICD-10-CM | POA: Insufficient documentation

## 2016-08-05 DIAGNOSIS — O0990 Supervision of high risk pregnancy, unspecified, unspecified trimester: Secondary | ICD-10-CM | POA: Insufficient documentation

## 2016-08-05 DIAGNOSIS — O09299 Supervision of pregnancy with other poor reproductive or obstetric history, unspecified trimester: Secondary | ICD-10-CM | POA: Insufficient documentation

## 2016-08-05 DIAGNOSIS — N898 Other specified noninflammatory disorders of vagina: Secondary | ICD-10-CM

## 2016-08-05 DIAGNOSIS — Z8759 Personal history of other complications of pregnancy, childbirth and the puerperium: Secondary | ICD-10-CM

## 2016-08-05 LAB — POCT URINALYSIS DIP (DEVICE)
BILIRUBIN URINE: NEGATIVE
Glucose, UA: NEGATIVE mg/dL
HGB URINE DIPSTICK: NEGATIVE
Ketones, ur: NEGATIVE mg/dL
NITRITE: NEGATIVE
Protein, ur: NEGATIVE mg/dL
Urobilinogen, UA: 0.2 mg/dL (ref 0.0–1.0)
pH: 6 (ref 5.0–8.0)

## 2016-08-05 NOTE — Patient Instructions (Addendum)
Declaracin de voluntad anticipada (Advance Directive)Segundo trimestre de Psychiatrist (Second Trimester of Pregnancy) El segundo trimestre va desde la semana13 hasta la 28, desde el cuarto hasta el sexto mes, y suele ser el momento en el que mejor se siente. Su organismo se ha adaptado a Charity fundraiser y comienza a Diplomatic Services operational officer. En general, las nuseas matutinas han disminuido o han desaparecido completamente, puede tener ms energa y un aumento de apetito. El segundo trimestre es tambin la poca en la que el feto se desarrolla rpidamente. Hacia el final del sexto mes, el feto mide aproximadamente 9pulgadas (23cm) y pesa alrededor de 1 libras (700g). Es probable que sienta que el beb se Teacher, English as a foreign language (da pataditas) entre las 18 y 20semanas del Psychiatrist. CAMBIOS EN EL ORGANISMO Su organismo atraviesa por muchos cambios durante el [place], y estos varan de Neomia Dear mujer a Educational psychologist.  Seguir [employer] Companies. Notar que la parte baja del abdomen sobresale.  Podrn aparecer las primeras [employer] caderas, el abdomen y las [place].  Es posible que tenga dolores de cabeza que pueden aliviarse con los medicamentos que el mdico le permita tomar.  Tal vez tenga necesidad de orinar con ms frecuencia porque el feto est ejerciendo presin Ambulance person.  Debido al Vanetta Mulders podr sentir Anthoney Harada estomacal con frecuencia.  Puede estar estreida, ya que ciertas hormonas enlentecen los movimientos de los msculos que [employer] desechos a travs de los intestinos.  Pueden aparecer hemorroides o abultarse e hincharse las venas (venas varicosas).  Puede tener dolor de espalda que se debe al Citigroup de peso y a que las hormonas del Management consultant las articulaciones entre los huesos de la pelvis, y Public librarian consecuencia de la modificacin del peso y los msculos que mantienen el equilibrio.  Las [employer] seguirn creciendo y Development worker, community.  Las Veterinary surgeon y estar sensibles al cepillado y  al hilo dental.  Pueden aparecer zonas oscuras o manchas (cloasma, mscara del Psychiatrist) en el rostro que probablemente se atenuar despus del nacimiento del beb.  Es posible que se forme una lnea oscura desde el ombligo hasta la zona del pubis (linea nigra) que probablemente se atenuar despus del nacimiento del beb.  Tal vez haya cambios en el cabello que pueden incluir su engrosamiento, crecimiento rpido y cambios en la textura. Adems, a algunas mujeres se les cae el cabello durante o despus del embarazo, o tienen el cabello seco o fino. Lo ms probable es que el cabello se le normalice despus del nacimiento del beb. QU DEBE ESPERAR EN LAS CONSULTAS PRENATALES Durante una visita prenatal de rutina:  La pesarn para asegurarse de que usted y el feto estn creciendo normalmente.  Le tomarn la presin arterial.  Le medirn el abdomen para controlar el desarrollo del beb.  Se escucharn los latidos cardacos fetales.  Se evaluarn los resultados de los estudios solicitados en visitas anteriores. El mdico puede preguntarle lo siguiente:  Cmo se siente.  Si siente los movimientos del beb.  Si ha tenido sntomas anormales, como prdida de lquido, [place], dolores de cabeza intensos o clicos abdominales.  Si est consumiendo algn producto que contenga tabaco, como cigarrillos, tabaco de Theatre manager y Administrator, Civil Service.  Si tiene [employer]. Otros estudios que podrn realizarse durante el segundo trimestre incluyen lo siguiente:  Anlisis de sangre para detectar lo siguiente:  Concentraciones de hierro bajas (anemia).  Diabetes gestacional (entre la semana 24 y la 28).  Anticuerpos Rh.  Anlisis de orina para detectar infecciones, diabetes  o protenas en la orina.  Una ecografa para confirmar que el beb crece y se desarrolla correctamente.  Una amniocentesis para diagnosticar posibles problemas genticos.  Estudios del feto para descartar espina  bfida y sndrome de Down.  Prueba del VIH (virus de inmunodeficiencia humana). Los exmenes prenatales de rutina incluyen la prueba de deteccin del VIH, a menos que decida no Futures trader. INSTRUCCIONES PARA EL CUIDADO EN EL HOGAR  Evite fumar, consumir hierbas, beber alcohol y tomar frmacos que no le hayan recetado. Estas sustancias qumicas afectan la formacin y el desarrollo del beb.  No consuma ningn producto que contenga tabaco, lo que incluye cigarrillos, tabaco de Theatre manager y Administrator, Civil Service. Si necesita ayuda para dejar de fumar, consulte al American Express. Puede recibir asesoramiento y otro tipo de recursos para dejar de fumar.  Siga las indicaciones del mdico en relacin con el uso de medicamentos. Durante el embarazo, hay medicamentos que son seguros de tomar y otros que no.  Haga ejercicio solamente como se lo haya indicado el mdico. Sentir clicos uterinos es un buen signo para Restaurant manager, fast food actividad fsica.  Contine comiendo alimentos sanos con regularidad.  Use un sostn que le brinde buen soporte si le [employer].  No se d baos de inmersin en agua caliente, baos turcos ni saunas.  Use el cinturn de seguridad en todo momento mientras conduce.  No coma carne cruda ni queso sin cocinar; evite el contacto con las bandejas sanitarias de los gatos y la tierra que estos animales usan. Estos elementos contienen grmenes que pueden causar defectos congnitos en el beb.  Tome las vitaminas prenatales.  Tome entre 1500 y 2000mg  de calcio diariamente comenzando en la semana20 del embarazo [place].  Si est estreida, pruebe un laxante suave (si el mdico lo autoriza). Consuma ms alimentos ricos en fibra, como vegetales y frutas frescos y Radiation protection practitioner. Beba gran cantidad de lquido para mantener la orina de tono claro o color amarillo plido.  Dese baos de asiento con agua tibia para Engineer, materials o las molestias causadas por las hemorroides. Use  una crema para las hemorroides si el mdico la autoriza.  Si tiene venas varicosas, use medias de descanso. Eleve los pies durante , 3 o 4veces por da. Limite el consumo de sal en su dieta.  No levante objetos pesados, use zapatos de tacones bajos y [address].  Descanse con las piernas elevadas si tiene calambres o dolor de cintura.  Visite a su dentista si an no lo ha Occupational hygienist. Use un cepillo de dientes blando para higienizarse los dientes y psese el hilo dental con suavidad.  Puede seguir [employer], a menos que el mdico le indique lo contrario.  Concurra a todas las visitas prenatales segn las indicaciones de su mdico. SOLICITE ATENCIN MDICA SI:  [place].  Siente clicos leves, presin en la pelvis o dolor persistente en el abdomen.  Tiene nuseas, vmitos o diarrea persistentes.  Brett Fairy secrecin vaginal con mal olor.  Siente dolor al [employer]oco[employer]. SOLICITE ATENCIN MDICA DE INMEDIATO SI:  Tiene fiebre.  Tiene una prdida de lquido por la vagina.  Tiene sangrado o pequeas prdidas vaginales.  Siente dolor intenso o clicos en el abdomen.  Sube o baja de peso rpidamente.  Tiene dificultad para respirar y siente dolor de pecho.  Sbitamente se le hinchan mucho el rostro, las [place], los tobillos, los pies o las piernas.  No ha sentido los movimientos del beb  durante una hora.  Siente un dolor de cabeza intenso que no se alivia con medicamentos.  Su visin se modifica. Esta informacin no tiene Theme park manager el consejo del mdico. Asegrese de hacerle al mdico cualquier pregunta que tenga. Document Released: 03/26/2005 Document Revised: 07/07/2014 Document Reviewed: 08/17/2012 Elsevier Interactive Patient Education  2017 [employer].  La declaracin de voluntad anticipada es el documento legal que le permite tomar decisiones acerca del tratamiento mdico y el cuidado de la  salud en caso de que no pueda expresarse por s mismo. Es la forma de Audiological scientist sus deseos a familiares, amigos y mdicos. Las personas especificadas pueden expresar sus decisiones acerca del cuidado en las etapas terminales de la vida, con el fin de evitar confusiones en caso de que usted no pueda comunicarse. Idealmente, el proceso de Chiropractor y Media planner de voluntad anticipada debe realizarse a lo largo del Chief Strategy Officer, Teacher, English as a foreign language de tomarse todas las decisiones a Licensed conveyancer. La declaracin de voluntad anticipada puede modificarse en cualquier momento si cambia su situacin, y usted puede [place] de idea en cualquier momento, incluso despus de haberla firmado. Cada estado tiene sus propias leyes con respecto a la declaracin de voluntad anticipada. Es posible que desee consultar a su mdico, su abogado o al representante estatal acerca de las leyes de su [place]. A continuacin se incluyen algunos ejemplos de declaracin de voluntad anticipada. APODERADO PARA LA ASISTENCIA SANITARIA Y PODER NOTARIAL DURADERO PARA ASUNTOS MDICOS El apoderado para la asistencia sanitaria es una persona (agente) designada para tomar decisiones mdicas en caso de que usted no pueda hacerlo. Generalmente se elige a Dealer conocidas y de confianza para representar sus preferencias cuando no estn en condiciones de hacerlo. Usted debe asegurarse de pedirle a esa persona realizar un acuerdo para actuar como su agente. El agente tendr que evaluar por s mismo en el caso de una decisin mdica para la cual no se conocen los deseos del [place]. Un poder notarial duradero para asuntos mdicos es un documento legal que nombra a su representante para temas de salud. En funcin de las leyes de su [place], una vez escrito el documento, es posible que adems deba:  [place].  Autenticado ante escribano.  Fechado.  Copiado.  Atestiguado.  Incorporado a los registros mdicos del [address]. Es posible que tambin desee  nombrar a alguien para Company secretary sus asuntos financieros, si no puede hacerlo usted mismo. Esto se denomina poder notarial duradero para asuntos financieros. Este es un documento legal diferente del poder notarial duradero para asuntos de salud. Usted puede elegir a la misma persona o a Museum/gallery curator de la designada como representante de sus asuntos de [place], para que acte como agente en los asuntos financieros. EL TESTAMENTO EN VIDA El testamento en vida es un conjunto de instrucciones que documentan sus deseos acerca de la atencin mdica para el momento en que no pueda valerse por s mismo. Se utiliza en caso de que a usted le ocurra lo siguiente:  Tenga una enfermedad terminal.  Est incapacitado.  No pueda comunicarse.  No sea capaz de tomar decisiones. Los puntos a Chartered certified accountant en vida incluyen lo siguiente:  El uso o no de equipos para Pharmacologist la vida, como dispositivos de dilisis y ventiladores (respiradores).  La orden de "no reanimar" (ONR), que es la instruccin de no hacer reanimacin cardiopulmonar si la respiracin o los latidos cardacos se detienen.  Alimentacin por sonda.  Suspensin de alimentos y lquidos.  Atencin de  alivio (cuidados paliativos) cuando el objetivo es la comodidad ms que la [place].  Donacin de rganos y tejidos. El testamento en vida no da instrucciones acerca de la distribucin del dinero y las propiedades en caso de fallecimiento. Es aconsejable buscar asesoramiento de un abogado especializado en redactar el testamento con respecto a las posesiones. Las Universal Healthdecisiones sobre impuestos, beneficiarios y distribucin de los activos sern legalmente vinculantes. Este proceso puede Paramedicaliviar a sus familiares y Personnel officeramigos de la carga de los conflictos o las cuestiones relacionadas con la distribucin de sus bienes. ORDEN DE NO REANIMAR (ONR) La orden de no reanimar (ONR) es el pedido de no Event organiserrealizar reanimacin cardiopulmonar (RCP) en el caso de que el  corazn o la respiracin se detengan. Excepto que se den CSX Corporationotras instrucciones, el mdico tratar de ayudar a todo paciente cuyo corazn se ha detenido o que ha dejado de Industrial/product designerrespirar. Esta informacin no tiene Theme park managercomo fin reemplazar el consejo del mdico. Asegrese de hacerle al mdico cualquier pregunta que tenga. Document Released: 02/16/2013 Document Revised: 10/08/2015 Document Reviewed: 11/03/2012 Elsevier Interactive Patient Education  2017 [employer]

## 2016-08-05 NOTE — Progress Notes (Signed)
  Subjective:    Rebecca HootsMaria Herminia Ibarra is a [id number] 8338w5d being seen today for her first obstetrical visit.  Her obstetrical history is significant for advanced maternal age and h/o stillbirth and possible SGA infant. Patient does intend to breast feed. Pregnancy history fully reviewed.  Patient reports no complaints.  Vitals:   08/05/16 1423 08/05/16 1440  BP:  105/68  Pulse:  91  Weight:  167 lb 4.8 oz (75.9 kg)  Height: 5\' 2"  (1.575 m)     HISTORY: OB History  Gravida Para Term Preterm AB Living  6 4 4  0 1 3  SAB TAB Ectopic Multiple Live Births  1 0 0 0 3    # Outcome Date GA Lbr Len/2nd Weight Sex Delivery Anes PTL Lv  6 Current           5 SAB 10/2015          4 Term 12/15/05 173w0d  6 lb 6 oz (2.892 kg) F Vag-Spont  N LIV  3 Term 12/27/01 3341w0d   M Vag-Spont  N LIV  2 Term 11/02/00 9341w0d  6 lb (2.722 kg) M Vag-Spont   LIV  1 Term 04/16/99 8141w0d   M Vag-Spont   FD     Past Medical History:  Diagnosis Date  . Medical history non-contributory    Past Surgical History:  Procedure Laterality Date  . APPENDECTOMY    . CHOLECYSTECTOMY  2015   Family History  Problem Relation Age of Onset  . Cancer Mother   . Heart disease Sister      Exam    Uterus:     Pelvic Exam:    Perineum: No Hemorrhoids   Vulva: normal   Vagina:  curdlike discharge, scant    pH:    Cervix: no lesions   Adnexa: not evaluated   Bony Pelvis: average  System: Breast:      Skin: normal coloration and turgor, no rashes    Neurologic: oriented   Extremities: normal strength, tone, and muscle mass   HEENT PERRLA   Mouth/Teeth dental hygiene good   Neck supple   Cardiovascular: regular rate and rhythm   Respiratory:  appears well, vitals normal, no respiratory distress, acyanotic, normal RR   Abdomen: soft, non-tender; bowel sounds normal; no masses,  no organomegaly   Urinary: urethral meatus normal      Assessment:    Pregnancy: [id number] Patient Active Problem List   Diagnosis Date Noted  . Supervision of normal pregnancy, antepartum 08/05/2016  . History of stillbirth 08/05/2016  . Advanced maternal age in multigravida 08/05/2016        Plan:     Initial labs drawn. Prenatal vitamins. Problem list reviewed and updated. Genetic Screening discussed Quad Screen: discussed, pending [place] result in 2 days.  Ultrasound discussed; fetal survey: ordered.  Follow up in 4 weeks. 50% of 30 min visit spent on counseling and coordination of care.  S>D today   Scheryl DarterJames Niyanna Asch 08/05/2016

## 2016-08-05 NOTE — Progress Notes (Signed)
Patient reports yellow d/c with itching and odor

## 2016-08-06 ENCOUNTER — Other Ambulatory Visit: Payer: Self-pay | Admitting: Infectious Disease

## 2016-08-06 ENCOUNTER — Ambulatory Visit
Admission: RE | Admit: 2016-08-06 | Discharge: 2016-08-06 | Disposition: A | Discharge status: Home / Self Care | Payer: PRIVATE HEALTH INSURANCE | Source: Ambulatory Visit | Attending: Infectious Disease | Admitting: Infectious Disease

## 2016-08-06 DIAGNOSIS — Z111 Encounter for screening for respiratory tuberculosis: Secondary | ICD-10-CM

## 2016-08-06 LAB — CERVICOVAGINAL ANCILLARY ONLY
BACTERIAL VAGINITIS: NEGATIVE
CANDIDA VAGINITIS: POSITIVE — AB
TRICH (WINDOWPATH): NEGATIVE

## 2016-08-07 ENCOUNTER — Ambulatory Visit (HOSPITAL_COMMUNITY): Admission: RE | Admit: 2016-08-07 | Payer: Medicaid Other | Source: Ambulatory Visit

## 2016-08-07 ENCOUNTER — Ambulatory Visit (HOSPITAL_COMMUNITY)
Admission: RE | Admit: 2016-08-07 | Discharge: 2016-08-07 | Disposition: A | Discharge status: Home / Self Care | Payer: Medicaid Other | Source: Ambulatory Visit | Attending: Nurse Practitioner | Admitting: Nurse Practitioner

## 2016-08-07 VITALS — BP 105/59 | HR 79 | Wt 167.2 lb

## 2016-08-07 DIAGNOSIS — Z363 Encounter for antenatal screening for malformations: Secondary | ICD-10-CM | POA: Diagnosis present

## 2016-08-07 DIAGNOSIS — Z3A18 18 weeks gestation of pregnancy: Secondary | ICD-10-CM | POA: Diagnosis not present

## 2016-08-07 DIAGNOSIS — O09522 Supervision of elderly multigravida, second trimester: Secondary | ICD-10-CM | POA: Insufficient documentation

## 2016-08-07 DIAGNOSIS — O09292 Supervision of pregnancy with other poor reproductive or obstetric history, second trimester: Secondary | ICD-10-CM | POA: Diagnosis not present

## 2016-08-07 DIAGNOSIS — Z3689 Encounter for other specified antenatal screening: Secondary | ICD-10-CM

## 2016-08-08 ENCOUNTER — Telehealth: Payer: Self-pay | Admitting: *Deleted

## 2016-08-08 DIAGNOSIS — B379 Candidiasis, unspecified: Secondary | ICD-10-CM

## 2016-08-08 MED ORDER — FLUCONAZOLE 150 MG PO TABS: 150.0000 mg | ORAL_TABLET | Freq: Once | ORAL | 0 refills | Status: AC

## 2016-08-08 NOTE — Telephone Encounter (Signed)
Attempted to contact patient using Spanish Pacific interpreter [phone]. There was no answer. A vm was left stating I am calling her with test results. Please return my call. Diflucan sent to pharmacy.

## 2016-08-08 NOTE — Telephone Encounter (Signed)
-----   Message from Adam PhenixJames G Arnold, MD sent at 08/07/2016  4:53 PM EST ----- Diflucan for yeast vaginitis

## 2016-08-11 ENCOUNTER — Other Ambulatory Visit: Payer: Self-pay

## 2016-08-11 MED ORDER — TERCONAZOLE 0.4 % VA CREA: 1.0000 | TOPICAL_CREAM | Freq: Every day | VAGINAL | 0 refills | Status: DC

## 2016-08-11 NOTE — Telephone Encounter (Signed)
Patient tested positive for yeast infection. Patient informed of test results and Terconazole has been called into the pharmacy.

## 2016-08-11 NOTE — Telephone Encounter (Signed)
Patient has been informed of test results and need to pick up her rx.

## 2016-08-18 ENCOUNTER — Telehealth: Payer: Self-pay

## 2016-08-18 NOTE — Telephone Encounter (Signed)
Pt called with her daughter interpreting for her.  Pt stated that she is having a lot of itching on BLE.  I confirmed with pt if she has changed detergents, soaps, or in generally made a recent change.  Pt stated "no she has not made any changes."  I advised pt that she can use Benadryl and Hydrocortisone cream OTC.  I advised pt to be aware that the Benadryl may cause drowsiness that it could help if she has had a reaction to something.  I also advised pt that if it has not gotten better by Friday morning to please give the office a call back.  Pt stated understanding with no further questions.

## 2016-08-21 ENCOUNTER — Other Ambulatory Visit (HOSPITAL_COMMUNITY): Payer: Self-pay | Admitting: *Deleted

## 2016-08-21 ENCOUNTER — Ambulatory Visit (HOSPITAL_COMMUNITY)
Admission: RE | Admit: 2016-08-21 | Discharge: 2016-08-21 | Disposition: A | Discharge status: Home / Self Care | Payer: PRIVATE HEALTH INSURANCE | Source: Ambulatory Visit | Attending: Nurse Practitioner | Admitting: Nurse Practitioner

## 2016-08-21 ENCOUNTER — Ambulatory Visit (INDEPENDENT_AMBULATORY_CARE_PROVIDER_SITE_OTHER): Payer: Medicaid Other | Admitting: Advanced Practice Midwife

## 2016-08-21 ENCOUNTER — Other Ambulatory Visit (HOSPITAL_COMMUNITY): Payer: Self-pay | Admitting: Obstetrics and Gynecology

## 2016-08-21 ENCOUNTER — Encounter: Payer: PRIVATE HEALTH INSURANCE | Admitting: Advanced Practice Midwife

## 2016-08-21 ENCOUNTER — Encounter (HOSPITAL_COMMUNITY): Payer: Self-pay

## 2016-08-21 VITALS — BP 103/62 | HR 87 | Wt 165.7 lb

## 2016-08-21 DIAGNOSIS — Z3A20 20 weeks gestation of pregnancy: Secondary | ICD-10-CM

## 2016-08-21 DIAGNOSIS — L299 Pruritus, unspecified: Secondary | ICD-10-CM

## 2016-08-21 DIAGNOSIS — Z315 Encounter for genetic counseling: Secondary | ICD-10-CM | POA: Insufficient documentation

## 2016-08-21 DIAGNOSIS — Z113 Encounter for screening for infections with a predominantly sexual mode of transmission: Secondary | ICD-10-CM

## 2016-08-21 DIAGNOSIS — O09522 Supervision of elderly multigravida, second trimester: Secondary | ICD-10-CM

## 2016-08-21 DIAGNOSIS — O09293 Supervision of pregnancy with other poor reproductive or obstetric history, third trimester: Secondary | ICD-10-CM | POA: Diagnosis not present

## 2016-08-21 DIAGNOSIS — O365920 Maternal care for other known or suspected poor fetal growth, second trimester, not applicable or unspecified: Secondary | ICD-10-CM

## 2016-08-21 DIAGNOSIS — Z8759 Personal history of other complications of pregnancy, childbirth and the puerperium: Secondary | ICD-10-CM

## 2016-08-21 DIAGNOSIS — O09292 Supervision of pregnancy with other poor reproductive or obstetric history, second trimester: Secondary | ICD-10-CM | POA: Insufficient documentation

## 2016-08-21 DIAGNOSIS — N898 Other specified noninflammatory disorders of vagina: Secondary | ICD-10-CM

## 2016-08-21 MED ORDER — HYDROXYZINE PAMOATE 25 MG PO CAPS: 25.0000 mg | ORAL_CAPSULE | Freq: Three times a day (TID) | ORAL | 2 refills | Status: DC | PRN

## 2016-08-21 MED ORDER — HYDROCORTISONE 0.5 % EX CREA: 1.0000 "application " | TOPICAL_CREAM | Freq: Two times a day (BID) | CUTANEOUS | 0 refills | Status: DC

## 2016-08-21 NOTE — Progress Notes (Signed)
Pt recently treated for yeast. Still feels very irritated and not any better. Pt had positive ppd at [facility], xray negative for current cardiopulmonary disease. Pt being treated for this by [facility].

## 2016-08-21 NOTE — Progress Notes (Signed)
   PRENATAL VISIT NOTE  Subjective:  Rebecca Ibarra is a 36 y.o. [phone] at 8490w0d being seen today for a problem visit for recurrent vaginal itching despite treatment for yeast infection.  She is currently monitored for the following issues for this low-risk pregnancy and has Supervision of normal pregnancy, antepartum; History of stillbirth; Advanced maternal age in multigravida; and [redacted] weeks gestation of pregnancy on her problem list.  Patient reports vaginal itching, itching of her legs, and itching of face/scalp with no visible rash.  Contractions: Not present. Vag. Bleeding: None.  Movement: Present. Denies leaking of fluid.   The following portions of the patient's history were reviewed and updated as appropriate: allergies, current medications, past family history, past medical history, past social history, past surgical history and problem list. Problem list updated.  Objective:   Vitals:   08/21/16 1251  BP: 103/62  Pulse: 87  Weight: 165 lb 11.2 oz (75.2 kg)    Fetal Status: Fetal Heart Rate (bpm): 152   Movement: Present     General:  Alert, oriented and cooperative. Patient is in no acute distress.  Skin: Skin is warm and dry. No rash noted. Dry patches of skin on legs and excoriations from scratching but no rash.  Cardiovascular: Normal heart rate noted  Respiratory: Normal respiratory effort, no problems with respiration noted  Abdomen: Soft, gravid, appropriate for gestational age. Pain/Pressure: Absent     Pelvic:  Cervical exam deferred        Extremities: Normal range of motion.  Edema: None  Mental Status: Normal mood and affect. Normal behavior. Normal judgment and thought content.   Assessment and Plan:  Pregnancy: [id number] at 7990w0d  1. Generalized pruritus --Vistaril and topical hydrocortisone cream - Bile acids, total  2. Vaginal itching --Self pay so antifungal/steroid cream too costly.  Use hydrocortisone cream externally for severe  itching until results of swab return.  No visible evidence on exam of yeast. - Cervicovaginal ancillary only  3. History of stillbirth --[place] today wnl, f/u in 6 weeks scheduled by MFM  Preterm labor symptoms and general obstetric precautions including but not limited to vaginal bleeding, contractions, leaking of fluid and fetal movement were reviewed in detail with the patient. Please refer to After Visit Summary for other counseling recommendations.  Return in about 4 weeks (around 09/18/2016).   Hurshel PartyLisa A Leftwich-Kirby, CNM

## 2016-08-21 NOTE — ED Notes (Signed)
Pt reports lower extremity itching, also having a thick, white discharge with vaginal itching.  Recently treated for a yeast infection.  Pt felt like symptoms slightly improved, but continues to have symptoms.

## 2016-08-21 NOTE — Progress Notes (Signed)
Genetic Counseling  High-Risk Gestation Note  Appointment Date:  08/21/2016 Referred By: Rebecca Ibarra, Rebecca K, NP Date of Birth:  10/25/1980   Pregnancy History: [id number] Estimated Date of Delivery: 01/08/17 Estimated Gestational Age: 8749w0d Attending: Alpha GulaPaul Whitecar, MD   Rebecca. Rebecca Ibarra was seen for genetic counseling because of a maternal age of 36 y.o..  [facility] Spanish/English interpreter, Cicero Duckrika, provided interpretation for today's visit.   In summary:  Discussed AMA and associated risk for fetal aneuploidy  Reviewed results of previous screening  Quad screen- within normal limits  Ultrasound- follow-up today, within normal limits  Discussed options for screening  NIPS- declined  Discussed diagnostic testing options  Amniocentesis- declined  Reviewed family history concerns  Discussed carrier screening options  CF- [facility], results not available today  SMA-declined  [facility], results not available today  She was counseled regarding maternal age and the association with risk for chromosome conditions due to nondisjunction with aging of the ova.   We reviewed chromosomes, nondisjunction, and the associated 1 in 111 risk for fetal aneuploidy related to a maternal age of 36 y.o. at 10949w0d gestation.  She was counseled that the risk for aneuploidy decreases as gestational age increases, accounting for those pregnancies which spontaneously abort.  We specifically discussed Down syndrome (trisomy 1521), trisomies 2913 and 2918, and sex chromosome aneuploidies (47,XXX and 47,XXY) including the common features and prognoses of each.   Rebecca Ibarra previously had Quad screening [facility], which was within normal limits for the conditions screened. We reviewed the reduction in risks for fetal Down syndrome (1 in 307 to 1 in 5,527), trisomy 18 (1  in 922 to 1 in 6,179) and ONTDs. We reviewed that screening tests are used to modify a patient's a priori risk for these conditions. We reviewed the sensitivity for each condition and discussed that it is not diagnostic, nor does it assess for all chromosome conditions.   We reviewed additional available screening options including noninvasive prenatal screening (NIPS)/cell free DNA (cfDNA) screening and detailed ultrasound.  She was counseled that screening tests are used to modify a patient's a priori risk for aneuploidy, typically based on age. This estimate provides a pregnancy specific risk assessment. We reviewed the benefits and limitations of each option. Specifically, we discussed the conditions for which each test screens, the detection rates, and false positive rates of each. She was also counseled regarding diagnostic testing via amniocentesis. We reviewed the approximate 1 in 300-500 risk for complications from amniocentesis, including spontaneous pregnancy loss. We discussed the possible results that the tests might provide including: positive, negative, unanticipated, and no result. Finally, they were counseled regarding the cost of each option and potential out of pocket expenses.   Follow-up ultrasound was performed today, and previous ultrasound performed on 08/07/16. Visualized fetal anatomy was within normal limits. Complete ultrasound results reported separately. After consideration of all the options, she declined NIPS and amniocentesis, stating she was comfortable with risk assessment performed via Quad screen and ultrasound.  She understands that screening tests cannot rule out all birth defects or genetic syndromes. The patient was advised of this limitation and states she still does not want additional testing at this time.   Rebecca Ibarra was provided with written information regarding cystic fibrosis (CF), spinal muscular atrophy (SMA) and hemoglobinopathies  including the carrier frequency, availability of carrier screening and prenatal diagnosis if indicated.  In  addition, we discussed that CF and hemoglobinopathies are routinely screened for as part of the [place] newborn screening panel.  After further discussion, she declined screening for SMA. CF carrier screening and hemoglobinopathy evaluation were performed through her OB provider. Results were not available to [place] at today's appointment.   Both family histories were reviewed and found to be contributory for stillbirth for the patient's first pregnancy, different partner from current pregnancy. She reported no known cause was determined, and he did not have visible birth defects. She reported that her three children since that pregnancy are healthy. Rebecca. Ronnell Ibarra also reported that her mother died in her mid-40's from "womb cancer." No additional relatives were reported with cancer. Though most cancers are thought to be sporadic or due to environmental factors, some families appear to have a strong predisposition to cancers.  When considering a family history of cancer, we look for common types of cancer in multiple family members occurring at younger than typical ages. We discussed the importance of the patient informing her physicians so that she can be screened appropriately. Additionally, there is the option of meeting with a cancer genetic counselor to discuss any possible screening or testing options available. If they are concerned about the family history of cancer and would like to learn more about the family's chance for an inherited cancer syndrome, her doctor may refer her to [facility] [phone]) or [facility] at [phone]. Without further information regarding the provided family history, an accurate genetic risk cannot be calculated. Further genetic counseling is warranted if more information is obtained.  Rebecca Ibarra denied exposure to environmental toxins or chemical agents. She denied the use of alcohol, tobacco or street drugs. She denied significant viral illnesses during the course of her pregnancy. Her medical and surgical histories were noncontributory.   I counseled Rebecca Ibarra regarding the above risks and available options.  The approximate face-to-face time with the genetic counselor was 40 minutes.  Quinn Plowman, Rebecca,  Certified [employer] 08/21/2016

## 2016-08-22 LAB — CERVICOVAGINAL ANCILLARY ONLY
BACTERIAL VAGINITIS: NEGATIVE
CHLAMYDIA, DNA PROBE: NEGATIVE
Candida vaginitis: NEGATIVE
NEISSERIA GONORRHEA: NEGATIVE
TRICH (WINDOWPATH): NEGATIVE

## 2016-08-24 LAB — BILE ACIDS, TOTAL: BILE ACIDS TOTAL: 8.8 umol/L (ref 4.7–24.5)

## 2016-09-04 ENCOUNTER — Ambulatory Visit (INDEPENDENT_AMBULATORY_CARE_PROVIDER_SITE_OTHER): Payer: PRIVATE HEALTH INSURANCE | Admitting: Obstetrics & Gynecology

## 2016-09-04 VITALS — BP 101/62 | HR 81 | Wt 167.0 lb

## 2016-09-04 DIAGNOSIS — O09522 Supervision of elderly multigravida, second trimester: Secondary | ICD-10-CM

## 2016-09-04 DIAGNOSIS — O9229 Other disorders of breast associated with pregnancy and the puerperium: Principal | ICD-10-CM

## 2016-09-04 DIAGNOSIS — Z8759 Personal history of other complications of pregnancy, childbirth and the puerperium: Secondary | ICD-10-CM

## 2016-09-04 DIAGNOSIS — N644 Mastodynia: Secondary | ICD-10-CM

## 2016-09-04 DIAGNOSIS — O9989 Other specified diseases and conditions complicating pregnancy, childbirth and the puerperium: Secondary | ICD-10-CM

## 2016-09-04 DIAGNOSIS — Z3480 Encounter for supervision of other normal pregnancy, unspecified trimester: Secondary | ICD-10-CM

## 2016-09-04 MED ORDER — PREDNISONE 5 MG PO TABS: 5.0000 mg | ORAL_TABLET | Freq: Every day | ORAL | 0 refills | Status: DC

## 2016-09-04 NOTE — Progress Notes (Signed)
Patient information sent to [facility] to assist with Breast Ultrasound.

## 2016-09-04 NOTE — Patient Instructions (Signed)

## 2016-09-04 NOTE — Progress Notes (Signed)
   PRENATAL VISIT NOTE  Subjective:  Kateri PlummerMaria Herminia Ronnell GuadalajaraDominguez Torres is a 36 y.o. [phone] at 5440w0d being seen today for ongoing prenatal care.  She is currently monitored for the following issues for this high-risk pregnancy and has Supervision of normal pregnancy, antepartum; History of stillbirth; and Advanced maternal age in multigravida on her problem list.  Patient reports vaginal irritation and itching and left breast pain for 2 weeks.  Contractions: Not present. Vag. Bleeding: None.  Movement: Present. Denies leaking of fluid.   The following portions of the patient's history were reviewed and updated as appropriate: allergies, current medications, past family history, past medical history, past social history, past surgical history and problem list. Problem list updated.  Objective:   Vitals:   09/04/16 0937  BP: 101/62  Pulse: 81  Weight: 167 lb (75.8 kg)    Fetal Status: Fetal Heart Rate (bpm): 150   Movement: Present     General:  Alert, oriented and cooperative. Patient is in no acute distress.  Skin: Skin is warm and dry. No rash noted.   Cardiovascular: Normal heart rate noted  Respiratory: Normal respiratory effort, no problems with respiration noted  Abdomen: Soft, gravid, appropriate for gestational age. Pain/Pressure: Absent     Pelvic:  Cervical exam deferred      MIld mucus d/c no lesions  Extremities: Normal range of motion.  Edema: None no rash  Mental Status: Normal mood and affect. Normal behavior. Normal judgment and thought content.  Breasts no mass mild tenderness on left outer quadrants no discharge Assessment and Plan:  Pregnancy: [id number] at 440w0d  1. Breast pain during pregnancy [place] ordered  2. History of stillbirth Fetal surveillance at 28 weeks, f/u growth  3. Supervision of other normal pregnancy, antepartum Vulvar and generalized itch no rash no response to treatments Will try short course of steroid  Preterm labor symptoms and general  obstetric precautions including but not limited to vaginal bleeding, contractions, leaking of fluid and fetal movement were reviewed in detail with the patient. Please refer to After Visit Summary for other counseling recommendations.  Return in about 4 weeks (around 10/02/2016).   Adam PhenixJames G Taedyn Glasscock, MD

## 2016-10-02 ENCOUNTER — Other Ambulatory Visit (HOSPITAL_COMMUNITY): Payer: Self-pay | Admitting: Maternal and Fetal Medicine

## 2016-10-02 ENCOUNTER — Encounter (HOSPITAL_COMMUNITY): Payer: Self-pay

## 2016-10-02 ENCOUNTER — Ambulatory Visit (HOSPITAL_COMMUNITY)
Admission: RE | Admit: 2016-10-02 | Discharge: 2016-10-02 | Disposition: A | Discharge status: Home / Self Care | Payer: PRIVATE HEALTH INSURANCE | Source: Ambulatory Visit | Attending: Nurse Practitioner | Admitting: Nurse Practitioner

## 2016-10-02 ENCOUNTER — Other Ambulatory Visit (HOSPITAL_COMMUNITY): Payer: Self-pay | Admitting: *Deleted

## 2016-10-02 ENCOUNTER — Ambulatory Visit (INDEPENDENT_AMBULATORY_CARE_PROVIDER_SITE_OTHER): Payer: PRIVATE HEALTH INSURANCE | Admitting: Obstetrics & Gynecology

## 2016-10-02 ENCOUNTER — Encounter: Payer: Self-pay | Admitting: Obstetrics & Gynecology

## 2016-10-02 VITALS — BP 95/63 | HR 87 | Wt 172.0 lb

## 2016-10-02 DIAGNOSIS — Z3A26 26 weeks gestation of pregnancy: Secondary | ICD-10-CM | POA: Insufficient documentation

## 2016-10-02 DIAGNOSIS — O09292 Supervision of pregnancy with other poor reproductive or obstetric history, second trimester: Secondary | ICD-10-CM | POA: Insufficient documentation

## 2016-10-02 DIAGNOSIS — O09522 Supervision of elderly multigravida, second trimester: Secondary | ICD-10-CM | POA: Insufficient documentation

## 2016-10-02 DIAGNOSIS — O09512 Supervision of elderly primigravida, second trimester: Secondary | ICD-10-CM

## 2016-10-02 DIAGNOSIS — L299 Pruritus, unspecified: Secondary | ICD-10-CM

## 2016-10-02 DIAGNOSIS — O365920 Maternal care for other known or suspected poor fetal growth, second trimester, not applicable or unspecified: Secondary | ICD-10-CM

## 2016-10-02 DIAGNOSIS — O26613 Liver and biliary tract disorders in pregnancy, third trimester: Secondary | ICD-10-CM

## 2016-10-02 DIAGNOSIS — Z362 Encounter for other antenatal screening follow-up: Secondary | ICD-10-CM | POA: Insufficient documentation

## 2016-10-02 DIAGNOSIS — O99712 Diseases of the skin and subcutaneous tissue complicating pregnancy, second trimester: Secondary | ICD-10-CM

## 2016-10-02 DIAGNOSIS — O0992 Supervision of high risk pregnancy, unspecified, second trimester: Secondary | ICD-10-CM

## 2016-10-02 DIAGNOSIS — K831 Obstruction of bile duct: Secondary | ICD-10-CM | POA: Insufficient documentation

## 2016-10-02 MED ORDER — URSODIOL 500 MG PO TABS: 500.0000 mg | ORAL_TABLET | Freq: Two times a day (BID) | ORAL | 3 refills | Status: DC

## 2016-10-02 MED ORDER — TRIAMCINOLONE ACETONIDE 0.5 % EX CREA: 1.0000 "application " | TOPICAL_CREAM | Freq: Three times a day (TID) | CUTANEOUS | 3 refills | Status: DC

## 2016-10-02 NOTE — Patient Instructions (Signed)
Regrese a la clinica cuando tenga su cita. Si tiene problemas o preguntas, llama a la clinica o vaya a la sala de emergencia al [facility].   Tercer trimestre de embarazo (Third Trimester of Pregnancy) El tercer trimestre comprende desde la semana29 hasta la semana42, es decir, desde el mes7 hasta el mes9. El tercer trimestre es un perodo en el que el feto crece rpidamente. Hacia el final del noveno mes, el feto mide alrededor de 20pulgadas (45cm) de largo y pesa entre 6 y 10 libras (2,700 y 4,500kg). CAMBIOS EN EL ORGANISMO Su organismo atraviesa por muchos cambios durante el embarazo, y estos varan de una mujer a otra.  Seguir aumentando de peso. Es de esperar que aumente entre 25 y 35libras (11 y 16kg) hacia el final del embarazo.  Podrn aparecer las primeras estras en las caderas, el abdomen y las mamas.  Puede tener necesidad de orinar con ms frecuencia porque el feto baja hacia la pelvis y ejerce presin sobre la vejiga.  Debido al embarazo podr sentir acidez estomacal con frecuencia.  Puede estar estreida, ya que ciertas hormonas enlentecen los movimientos de los msculos que empujan los desechos a travs de los intestinos.  Pueden aparecer hemorroides o abultarse e hincharse las venas (venas varicosas).  Puede sentir dolor plvico debido al aumento de peso y a que las hormonas del embarazo relajan las articulaciones entre los huesos de la pelvis. El dolor de espalda puede ser consecuencia de la sobrecarga de los msculos que soportan la postura.  Tal vez haya cambios en el cabello que pueden incluir su engrosamiento, crecimiento rpido y cambios en la textura. Adems, a algunas mujeres se les cae el cabello durante o despus del embarazo, o tienen el cabello seco o fino. Lo ms probable es que el cabello se le normalice despus del nacimiento del beb.  Las mamas seguirn creciendo y le dolern. A veces, puede haber una secrecin amarilla de las mamas  llamada calostro.  El ombligo puede salir hacia afuera.  Puede sentir que le falta el aire debido a que se expande el tero.  Puede notar que el feto "baja" o lo siente ms bajo, en el abdomen.  Puede tener una prdida de secrecin mucosa con sangre. Esto suele ocurrir en el trmino de unos pocos das a una semana antes de que comience el trabajo de parto.  El cuello del tero se vuelve delgado y blando (se borra) cerca de la fecha de parto. QU DEBE ESPERAR EN LOS EXMENES PRENATALES Le harn exmenes prenatales cada 2semanas hasta la semana36. A partir de ese momento le harn exmenes semanales. Durante una visita prenatal de rutina:  La pesarn para asegurarse de que usted y el feto estn creciendo normalmente.  Le tomarn la presin arterial.  Le medirn el abdomen para controlar el desarrollo del beb.  Se escucharn los latidos cardacos fetales.  Se evaluarn los resultados de los estudios solicitados en visitas anteriores.  Le revisarn el cuello del tero cuando est prxima la fecha de parto para controlar si este se ha borrado. Alrededor de la semana36, el mdico le revisar el cuello del tero. Al mismo tiempo, realizar un anlisis de las secreciones del tejido vaginal. Este examen es para determinar si hay un tipo de bacteria, estreptococo Grupo B. El mdico le explicar esto con ms detalle. El mdico puede preguntarle lo siguiente:  Cmo le gustara que fuera el parto.  Cmo se siente.  Si siente los movimientos del beb.  Si ha   tenido sntomas anormales, como prdida de lquido, sangrado, dolores de cabeza intensos o clicos abdominales.  Si est consumiendo algn producto que contenga tabaco, como cigarrillos, tabaco de mascar y cigarrillos electrnicos.  Si tiene alguna pregunta. Otros exmenes o estudios de deteccin que pueden realizarse durante el tercer trimestre incluyen lo siguiente:  Anlisis de sangre para controlar los niveles de hierro  (anemia).  Controles fetales para determinar su salud, nivel de actividad y crecimiento. Si tiene alguna enfermedad o hay problemas durante el embarazo, le harn estudios.  Prueba del VIH (virus de inmunodeficiencia humana). Si corre un riesgo alto, pueden realizarle una prueba de deteccin del VIH durante el tercer trimestre del embarazo. FALSO TRABAJO DE PARTO Es posible que sienta contracciones leves e irregulares que finalmente desaparecen. Se llaman contracciones de Braxton Hicks o falso trabajo de parto. Las contracciones pueden durar horas, das o incluso semanas, antes de que el verdadero trabajo de parto se inicie. Si las contracciones ocurren a intervalos regulares, se intensifican o se hacen dolorosas, lo mejor es que la revise el mdico. SIGNOS DE TRABAJO DE PARTO  Clicos de tipo menstrual.  Contracciones cada 5minutos o menos.  Contracciones que comienzan en la parte superior del tero y se extienden hacia abajo, a la zona inferior del abdomen y la espalda.  Sensacin de mayor presin en la pelvis o dolor de espalda.  Una secrecin de mucosidad acuosa o con sangre que sale de la vagina. Si tiene alguno de estos signos antes de la semana37 del embarazo, llame a su mdico de inmediato. Debe concurrir al hospital para que la controlen inmediatamente. INSTRUCCIONES PARA EL CUIDADO EN EL HOGAR  Evite fumar, consumir hierbas, beber alcohol y tomar frmacos que no le hayan recetado. Estas sustancias qumicas afectan la formacin y el desarrollo del beb.  No consuma ningn producto que contenga tabaco, lo que incluye cigarrillos, tabaco de mascar y cigarrillos electrnicos. Si necesita ayuda para dejar de fumar, consulte al mdico. Puede recibir asesoramiento y otro tipo de recursos para dejar de fumar.  Siga las indicaciones del mdico en relacin con el uso de medicamentos. Durante el embarazo, hay medicamentos que son seguros de tomar y otros que no.  Haga ejercicio solamente  como se lo haya indicado el mdico. Sentir clicos uterinos es un buen signo para detener la actividad fsica.  Contine comiendo alimentos sanos con regularidad.  Use un sostn que le brinde buen soporte si le duelen las mamas.  No se d baos de inmersin en agua caliente, baos turcos ni saunas.  Use el cinturn de seguridad en todo momento mientras conduce.  No coma carne cruda ni queso sin cocinar; evite el contacto con las bandejas sanitarias de los gatos y la tierra que estos animales usan. Estos elementos contienen grmenes que pueden causar defectos congnitos en el beb.  Tome las vitaminas prenatales.  Tome entre 1500 y 2000mg de calcio diariamente comenzando en la semana20 del embarazo hasta el parto.  Si est estreida, pruebe un laxante suave (si el mdico lo autoriza). Consuma ms alimentos ricos en fibra, como vegetales y frutas frescos y cereales integrales. Beba gran cantidad de lquido para mantener la orina de tono claro o color amarillo plido.  Dese baos de asiento con agua tibia para aliviar el dolor o las molestias causadas por las hemorroides. Use una crema para las hemorroides si el mdico la autoriza.  Si tiene venas varicosas, use medias de descanso. Eleve los pies durante 15minutos, 3 o 4veces por da. Limite   el consumo de sal en su dieta.  Evite levantar objetos pesados, use zapatos de tacones bajos y mantenga una buena postura.  Descanse con las piernas elevadas si tiene calambres o dolor de cintura.  Visite a su dentista si no lo ha hecho durante el embarazo. Use un cepillo de dientes blando para higienizarse los dientes y psese el hilo dental con suavidad.  Puede seguir manteniendo relaciones sexuales, a menos que el mdico le indique lo contrario.  No haga viajes largos excepto que sea absolutamente necesario y solo con la autorizacin del mdico.  Tome clases prenatales para entender, practicar y hacer preguntas sobre el trabajo de parto y el  parto.  Haga un ensayo de la partida al hospital.  Prepare el bolso que llevar al hospital.  Prepare la habitacin del beb.  Concurra a todas las visitas prenatales segn las indicaciones de su mdico.  SOLICITE ATENCIN MDICA SI:  No est segura de que est en trabajo de parto o de que ha roto la bolsa de las aguas.  Tiene mareos.  Siente clicos leves, presin en la pelvis o dolor persistente en el abdomen.  Tiene nuseas, vmitos o diarrea persistentes.  Observa una secrecin vaginal con mal olor.  Siente dolor al orinar.  SOLICITE ATENCIN MDICA DE INMEDIATO SI:  Tiene fiebre.  Tiene una prdida de lquido por la vagina.  Tiene sangrado o pequeas prdidas vaginales.  Siente dolor intenso o clicos en el abdomen.  Sube o baja de peso rpidamente.  Tiene dificultad para respirar y siente dolor de pecho.  Sbitamente se le hinchan mucho el rostro, las manos, los tobillos, los pies o las piernas.  No ha sentido los movimientos del beb durante una hora.  Siente un dolor de cabeza intenso que no se alivia con medicamentos.  Su visin se modifica.  Esta informacin no tiene como fin reemplazar el consejo del mdico. Asegrese de hacerle al mdico cualquier pregunta que tenga. Document Released: 03/26/2005 Document Revised: 07/07/2014 Document Reviewed: 08/17/2012 Elsevier Interactive Patient Education  2017 Elsevier Inc.  

## 2016-10-02 NOTE — Progress Notes (Signed)
   PRENATAL VISIT NOTE  Subjective:  Rebecca Ibarra is a 36 y.o. [phone] at [redacted]w[redacted]d being seen today for ongoing prenatal care.  She is currently monitored for the following issues for this high-risk pregnancy and has Supervision of high-risk pregnancy; Prior pregnancy with fetal demise at term, antepartum; Advanced maternal age in multigravida; and Pruritus of pregnancy, antepartum on her problem list. Patient is Spanish-speaking only, Spanish interpreter present for this encounter.  Patient reports worsening whole body pruritus. Initially, started off as vulvar itching: has been treated for yeast; negative follow up testing on 08/21/2016.  Given topical and oral steroids.  She then reported diffuse pruritus,  bile acids 8.8 on 08/21/2016.  Also given Atarax.   Today, she reports worsening whole body pruritus.   Contractions: Irregular. Vag. Bleeding: None.  Movement: Present. Denies leaking of fluid.   The following portions of the patient's history were reviewed and updated as appropriate: allergies, current medications, past family history, past medical history, past social history, past surgical history and problem list. Problem list updated.  Objective:   Vitals:   10/02/16 0837  BP: 95/63  Pulse: 87  Weight: 172 lb (78 kg)    Fetal Status: Fetal Heart Rate (bpm): 148   Movement: Present     General:  Alert, oriented and cooperative. Patient is in no acute distress.  Skin: Skin is warm and dry. No rash noted.   Cardiovascular: Normal heart rate noted  Respiratory: Normal respiratory effort, no problems with respiration noted  Abdomen: Soft, gravid, appropriate for gestational age. Pain/Pressure: Present     Pelvic:  Cervical exam deferred        Extremities: Normal range of motion.  Edema: None  Mental Status: Normal mood and affect. Normal behavior. Normal judgment and thought content.   Assessment and Plan:  Pregnancy: [id number] at [redacted]w[redacted]d  1. Pruritus of pregnancy  in second trimester Possible cholestasis, bile acids 8.8 in 08/21/2016.  Bile acids/CMET rechecked, will presumptively start Ursodiol - triamcinolone cream (KENALOG) 0.5 %; Apply 1 application topically 3 (three) times daily.  Dispense: 15 g; Refill: 3 - ursodiol (ACTIGALL) 500 MG tablet; Take 1 tablet (500 mg total) by mouth 2 (two) times daily.  Dispense: 60 tablet; Refill: 3 - Comprehensive metabolic panel - Bile acids, total  2. Prior pregnancy with fetal demise and current pregnancy in second trimester Will start antenatal testing at 28 weeks for h/o term IUFD - [place] MFM FETAL BPP WO NON STRESS; Future - [place] MFM FETAL BPP WO NON STRESS; Future  3. Elderly multigravida in second trimester 4. Supervision of high risk pregnancy in second trimester Preterm labor symptoms and general obstetric precautions including but not limited to vaginal bleeding, contractions, leaking of fluid and fetal movement were reviewed in detail with the patient. Please refer to After Visit Summary for other counseling recommendations.  Return in about 2 weeks (around 10/16/2016) for 2 hr GTT, 3rd trimester labs, TDap, OB Visit (HOB).   Tereso Newcomer, MD

## 2016-10-02 NOTE — Addendum Note (Signed)
Addended by: Jaynie Collins A on: 10/02/2016 09:28 AM   Modules accepted: Orders

## 2016-10-04 LAB — COMPREHENSIVE METABOLIC PANEL
ALBUMIN: 3.5 g/dL (ref 3.5–5.5)
ALK PHOS: 62 IU/L (ref 39–117)
ALT: 12 IU/L (ref 0–32)
AST: 13 IU/L (ref 0–40)
Albumin/Globulin Ratio: 1.3 (ref 1.2–2.2)
BILIRUBIN TOTAL: 0.3 mg/dL (ref 0.0–1.2)
BUN / CREAT RATIO: 13 (ref 9–23)
BUN: 7 mg/dL (ref 6–20)
CHLORIDE: 99 mmol/L (ref 96–106)
CO2: 23 mmol/L (ref 18–29)
Calcium: 8.9 mg/dL (ref 8.7–10.2)
Creatinine, Ser: 0.54 mg/dL — ABNORMAL LOW (ref 0.57–1.00)
GFR calc Af Amer: 140 mL/min/{1.73_m2} (ref >59)
GFR calc non Af Amer: 122 mL/min/{1.73_m2} (ref >59)
GLOBULIN, TOTAL: 2.7 g/dL (ref 1.5–4.5)
Glucose: 66 mg/dL (ref 65–99)
POTASSIUM: 4.2 mmol/L (ref 3.5–5.2)
SODIUM: 135 mmol/L (ref 134–144)
Total Protein: 6.2 g/dL (ref 6.0–8.5)

## 2016-10-04 LAB — BILE ACIDS, TOTAL: BILE ACIDS TOTAL: 7.5 umol/L (ref 4.7–24.5)

## 2016-10-07 ENCOUNTER — Telehealth: Payer: Self-pay | Admitting: *Deleted

## 2016-10-07 NOTE — Telephone Encounter (Signed)
Per message from Dr. Macon Large need to call patient and notify labs normal, not indicative of cholestasis. Need to ask patient if her symptoms improved after taking ursodiol - may be atypical case of cholestasis.   I called patient with pacific interpreter [phone] and discussed results and information from Dr. Macon Large. Patient states she has started taking the ursodiol and itching has gotten better but not gone away completely. I instructed her to continue taking and review with provider at next appointment- I reviewed next appt date/time with her. She voices understanding.

## 2016-10-16 ENCOUNTER — Encounter (HOSPITAL_COMMUNITY): Payer: Self-pay

## 2016-10-16 ENCOUNTER — Other Ambulatory Visit: Payer: Self-pay | Admitting: Obstetrics & Gynecology

## 2016-10-16 ENCOUNTER — Ambulatory Visit (HOSPITAL_COMMUNITY)
Admission: RE | Admit: 2016-10-16 | Discharge: 2016-10-16 | Disposition: A | Discharge status: Home / Self Care | Payer: Self-pay | Source: Ambulatory Visit | Attending: Obstetrics & Gynecology | Admitting: Obstetrics & Gynecology

## 2016-10-16 DIAGNOSIS — Z3A28 28 weeks gestation of pregnancy: Secondary | ICD-10-CM

## 2016-10-16 DIAGNOSIS — O09523 Supervision of elderly multigravida, third trimester: Secondary | ICD-10-CM

## 2016-10-16 DIAGNOSIS — O09293 Supervision of pregnancy with other poor reproductive or obstetric history, third trimester: Secondary | ICD-10-CM | POA: Insufficient documentation

## 2016-10-16 DIAGNOSIS — O09292 Supervision of pregnancy with other poor reproductive or obstetric history, second trimester: Secondary | ICD-10-CM

## 2016-10-20 ENCOUNTER — Ambulatory Visit (INDEPENDENT_AMBULATORY_CARE_PROVIDER_SITE_OTHER): Payer: Self-pay | Admitting: Obstetrics & Gynecology

## 2016-10-20 VITALS — BP 93/60 | HR 89 | Wt 169.1 lb

## 2016-10-20 DIAGNOSIS — O0993 Supervision of high risk pregnancy, unspecified, third trimester: Secondary | ICD-10-CM

## 2016-10-20 DIAGNOSIS — O09293 Supervision of pregnancy with other poor reproductive or obstetric history, third trimester: Secondary | ICD-10-CM

## 2016-10-20 DIAGNOSIS — O99713 Diseases of the skin and subcutaneous tissue complicating pregnancy, third trimester: Secondary | ICD-10-CM

## 2016-10-20 DIAGNOSIS — O99719 Diseases of the skin and subcutaneous tissue complicating pregnancy, unspecified trimester: Principal | ICD-10-CM

## 2016-10-20 DIAGNOSIS — Z23 Encounter for immunization: Secondary | ICD-10-CM

## 2016-10-20 DIAGNOSIS — O09299 Supervision of pregnancy with other poor reproductive or obstetric history, unspecified trimester: Secondary | ICD-10-CM | POA: Insufficient documentation

## 2016-10-20 DIAGNOSIS — L299 Pruritus, unspecified: Secondary | ICD-10-CM

## 2016-10-20 NOTE — Progress Notes (Signed)
Spanish--JB    PRENATAL VISIT NOTE  Subjective:  Rebecca Ibarra is a 36 y.o. [phone] at [redacted]w[redacted]d being seen today for ongoing prenatal care.  She is currently monitored for the following issues for this high-risk pregnancy and has Supervision of high-risk pregnancy; Prior pregnancy with fetal demise at term, antepartum; Advanced maternal age in multigravida; Pruritus of pregnancy, antepartum; and History of oligohydramnios in prior pregnancy, currently pregnant on her problem list.  Patient reports body itching.  Contractions: Irregular. Vag. Bleeding: None.  Movement: Present. Denies leaking of fluid.   The following portions of the patient's history were reviewed and updated as appropriate: allergies, current medications, past family history, past medical history, past social history, past surgical history and problem list. Problem list updated.  Objective:   Vitals:   10/20/16 1029  BP: 93/60  Pulse: 89  Weight: 169 lb 1.6 oz (76.7 kg)    Fetal Status: Fetal Heart Rate (bpm): 123   Movement: Present     General:  Alert, oriented and cooperative. Patient is in no acute distress.  Skin: Skin is warm and dry. No rash noted.   Cardiovascular: Normal heart rate noted  Respiratory: Normal respiratory effort, no problems with respiration noted  Abdomen: Soft, gravid, appropriate for gestational age. Pain/Pressure: Present     Pelvic:  Cervical exam deferred        Extremities: Normal range of motion.  Edema: None  Mental Status: Normal mood and affect. Normal behavior. Normal judgment and thought content.   Assessment and Plan:  Pregnancy: A5W0981 at [redacted]w[redacted]d  1. Supervision of high risk pregnancy in third trimester Tdap, GTT 2 hour  2. History of oligohydramnios in prior pregnancy, currently pregnant Term IUFD, oligo with last pregnancy-->start 2x week testing at 32 weeks Serial growth [place] (has appts)  3. Pruritus of pregnancy, antepartum Head now itching; no lice  on exam.  Suggest anti itch shampoo  Preterm labor symptoms and general obstetric precautions including but not limited to vaginal bleeding, contractions, leaking of fluid and fetal movement were reviewed in detail with the patient. Please refer to After Visit Summary for other counseling recommendations.  Return in about 2 weeks (around 11/03/2016).   Lesly Dukes, MD

## 2016-10-21 LAB — CBC
HEMOGLOBIN: 11.3 g/dL (ref 11.1–15.9)
Hematocrit: 35 % (ref 34.0–46.6)
MCH: 27 pg (ref 26.6–33.0)
MCHC: 32.3 g/dL (ref 31.5–35.7)
MCV: 84 fL (ref 79–97)
Platelets: 324 10*3/uL (ref 150–379)
RBC: 4.19 x10E6/uL (ref 3.77–5.28)
RDW: 14.2 % (ref 12.3–15.4)
WBC: 8.6 10*3/uL (ref 3.4–10.8)

## 2016-10-21 LAB — GLUCOSE TOLERANCE, 2 HOURS W/ 1HR
GLUCOSE, 2 HOUR: 73 mg/dL (ref 65–152)
GLUCOSE, FASTING: 75 mg/dL (ref 65–91)
Glucose, 1 hour: 86 mg/dL (ref 65–179)

## 2016-10-21 LAB — RPR: RPR: NONREACTIVE

## 2016-10-21 LAB — HIV ANTIBODY (ROUTINE TESTING W REFLEX): HIV SCREEN 4TH GENERATION: NONREACTIVE

## 2016-10-22 LAB — CARDIOLIPIN ANTIBODIES, IGG, IGM, IGA: Anticardiolipin IgG: <9 GPL U/mL (ref 0–14)

## 2016-10-22 LAB — LUPUS ANTICOAGULANT PANEL

## 2016-10-22 LAB — BETA-2-GLYCOPROTEIN I ABS, IGG/M/A
Beta-2 Glyco 1 IgA: <9 GPI IgA units (ref 0–25)
Beta-2 Glyco I IgG: <9 GPI IgG units (ref 0–20)

## 2016-10-23 ENCOUNTER — Other Ambulatory Visit (HOSPITAL_COMMUNITY): Payer: Self-pay | Admitting: *Deleted

## 2016-10-23 ENCOUNTER — Ambulatory Visit (HOSPITAL_COMMUNITY)
Admission: RE | Admit: 2016-10-23 | Discharge: 2016-10-23 | Disposition: A | Discharge status: Home / Self Care | Payer: Self-pay | Source: Ambulatory Visit | Attending: Obstetrics & Gynecology | Admitting: Obstetrics & Gynecology

## 2016-10-23 ENCOUNTER — Other Ambulatory Visit: Payer: Self-pay | Admitting: Obstetrics & Gynecology

## 2016-10-23 ENCOUNTER — Encounter (HOSPITAL_COMMUNITY): Payer: Self-pay

## 2016-10-23 DIAGNOSIS — O09523 Supervision of elderly multigravida, third trimester: Secondary | ICD-10-CM

## 2016-10-23 DIAGNOSIS — O09292 Supervision of pregnancy with other poor reproductive or obstetric history, second trimester: Secondary | ICD-10-CM

## 2016-10-23 DIAGNOSIS — O09293 Supervision of pregnancy with other poor reproductive or obstetric history, third trimester: Secondary | ICD-10-CM | POA: Insufficient documentation

## 2016-10-23 DIAGNOSIS — Z3A29 29 weeks gestation of pregnancy: Secondary | ICD-10-CM | POA: Insufficient documentation

## 2016-10-23 NOTE — Procedures (Signed)
Rebecca Ibarra August 16, 1980 [redacted]w[redacted]d  Fetus A Non-Stress Test Interpretation for 10/23/16  Indication: Unsatisfactory BPP  Fetal Heart Rate A Mode: External Baseline Rate (A): 135 bpm Variability: Moderate Accelerations: 10 x 10, 15 x 15 Decelerations: None Multiple birth?: No  Uterine Activity Mode: Palpation, Toco Contraction Frequency (min): none  Interpretation (Fetal Testing) Nonstress Test Interpretation: Reactive Overall Impression: Reassuring for gestational age Comments: Reviewed tracing with Dr. Sherrie George

## 2016-10-30 ENCOUNTER — Ambulatory Visit (HOSPITAL_COMMUNITY)
Admission: RE | Admit: 2016-10-30 | Discharge: 2016-10-30 | Disposition: A | Discharge status: Home / Self Care | Payer: Self-pay | Source: Ambulatory Visit | Attending: Obstetrics & Gynecology | Admitting: Obstetrics & Gynecology

## 2016-10-30 ENCOUNTER — Other Ambulatory Visit (HOSPITAL_COMMUNITY): Payer: Self-pay | Admitting: Maternal and Fetal Medicine

## 2016-10-30 ENCOUNTER — Other Ambulatory Visit (HOSPITAL_COMMUNITY): Payer: Self-pay | Admitting: Obstetrics and Gynecology

## 2016-10-30 ENCOUNTER — Encounter (HOSPITAL_COMMUNITY): Payer: Self-pay

## 2016-10-30 VITALS — BP 100/58 | HR 75 | Wt 173.6 lb

## 2016-10-30 DIAGNOSIS — O09523 Supervision of elderly multigravida, third trimester: Secondary | ICD-10-CM

## 2016-10-30 DIAGNOSIS — O09299 Supervision of pregnancy with other poor reproductive or obstetric history, unspecified trimester: Secondary | ICD-10-CM

## 2016-10-30 DIAGNOSIS — O09512 Supervision of elderly primigravida, second trimester: Secondary | ICD-10-CM

## 2016-10-30 DIAGNOSIS — Z3A30 30 weeks gestation of pregnancy: Secondary | ICD-10-CM

## 2016-10-30 DIAGNOSIS — O09293 Supervision of pregnancy with other poor reproductive or obstetric history, third trimester: Secondary | ICD-10-CM | POA: Insufficient documentation

## 2016-11-05 ENCOUNTER — Encounter: Payer: Self-pay | Admitting: Obstetrics and Gynecology

## 2016-11-05 ENCOUNTER — Ambulatory Visit (INDEPENDENT_AMBULATORY_CARE_PROVIDER_SITE_OTHER): Payer: Self-pay | Admitting: Obstetrics and Gynecology

## 2016-11-05 VITALS — BP 101/65 | HR 89 | Wt 170.0 lb

## 2016-11-05 DIAGNOSIS — O09299 Supervision of pregnancy with other poor reproductive or obstetric history, unspecified trimester: Secondary | ICD-10-CM

## 2016-11-05 DIAGNOSIS — L299 Pruritus, unspecified: Secondary | ICD-10-CM

## 2016-11-05 DIAGNOSIS — O09293 Supervision of pregnancy with other poor reproductive or obstetric history, third trimester: Secondary | ICD-10-CM

## 2016-11-05 DIAGNOSIS — O0993 Supervision of high risk pregnancy, unspecified, third trimester: Secondary | ICD-10-CM

## 2016-11-05 DIAGNOSIS — Z789 Other specified health status: Secondary | ICD-10-CM | POA: Insufficient documentation

## 2016-11-05 DIAGNOSIS — R7611 Nonspecific reaction to tuberculin skin test without active tuberculosis: Secondary | ICD-10-CM

## 2016-11-05 DIAGNOSIS — O99719 Diseases of the skin and subcutaneous tissue complicating pregnancy, unspecified trimester: Secondary | ICD-10-CM

## 2016-11-05 DIAGNOSIS — O99713 Diseases of the skin and subcutaneous tissue complicating pregnancy, third trimester: Secondary | ICD-10-CM

## 2016-11-05 DIAGNOSIS — O09523 Supervision of elderly multigravida, third trimester: Secondary | ICD-10-CM

## 2016-11-05 DIAGNOSIS — Z227 Latent tuberculosis: Secondary | ICD-10-CM | POA: Insufficient documentation

## 2016-11-05 DIAGNOSIS — O99712 Diseases of the skin and subcutaneous tissue complicating pregnancy, second trimester: Secondary | ICD-10-CM

## 2016-11-05 NOTE — Progress Notes (Signed)
Prenatal Visit Note Date: 11/05/2016 Clinic: [facility]  Subjective:  Kateri PlummerMaria Herminia Ronnell GuadalajaraDominguez Torres is a 36 y.o. [phone] at 7645w6d being seen today for ongoing prenatal care.  She is currently monitored for the following issues for this high-risk pregnancy and has Supervision of high-risk pregnancy; Prior pregnancy with fetal demise at term, antepartum; Advanced maternal age in multigravida; Pruritus of pregnancy, antepartum; History of oligohydramnios in prior pregnancy, currently pregnant; Positive PPD; and Language barrier on her problem list.  Patient reports no complaints.   Contractions: Not present.  .  Movement: Present. Denies leaking of fluid.   The following portions of the patient's history were reviewed and updated as appropriate: allergies, current medications, past family history, past medical history, past social history, past surgical history and problem list. Problem list updated.  Objective:   Vitals:   11/05/16 1500  BP: 101/65  Pulse: 89  Weight: 170 lb (77.1 kg)    Fetal Status: Fetal Heart Rate (bpm): 140 Fundal Height: 30 cm Movement: Present     General:  Alert, oriented and cooperative. Patient is in no acute distress.  Skin: Skin is warm and dry. No rash noted.   Cardiovascular: Normal heart rate noted  Respiratory: Normal respiratory effort, no problems with respiration noted  Abdomen: Soft, gravid, appropriate for gestational age. Pain/Pressure: Present     Pelvic:  Cervical exam deferred        Extremities: Normal range of motion.  Edema: None  Mental Status: Normal mood and affect. Normal behavior. Normal judgment and thought content.   Urinalysis:      Assessment and Plan:  Pregnancy: [id number] at 7145w6d  1. Positive PPD Patient states she was called by the HD and told she needed to do a medication x 5355m and get surveillance labs. Looking back to early feb 2018 TB clinic notes, the sputum results were pending but the MD was  leaning towards doing 7755m of meds and surveillance; the HPI of that note did state she was pregnant. inbasket request sent to clinic RN and KR to follow up on these notes to verify. RTC 1wk for closer f/u and see if she needs to start tx.  2. Supervision of high risk pregnancy in third trimester Routine care. Nexplanon  3. Prior pregnancy with fetal demise and current pregnancy in third trimester Has BPP tomorrow. Rpt qwk until 2x/week testing at 32wks. Normal growth/ac/afi last week - Lupus anticoagulant redrawn today - US MFM FETAL BPP WO NON STRESS; Future  4. Language barrier Interpreter used  5. Elderly multigravida in third trimester No change in plan of care  6. History of oligohydramnios in prior pregnancy, currently pregnant No change in plan of care  7. Pruritus of pregnancy, antepartum Pt is on bid actigall and she states this made her itching much better. BA highest up to 8.8. Will re-draw today, but given the fact that her itching got better on actigall, if her BA is still normal today, would recommend treating her like an ICP patient with IOL at 37wks; will d/w group PRN, after BA is back for consensus - Bile acids, total - Comprehensive metabolic panel  Preterm labor symptoms and general obstetric precautions including but not limited to vaginal bleeding, contractions, leaking of fluid and fetal movement were reviewed in detail with the patient. Please refer to After Visit Summary for other counseling recommendations.  Return in about 8 days (around 11/13/2016) for 8d rob and 2wk nst/afi (start 2x/week testing then).   Bryor Rami,  Eduard Clos, MD

## 2016-11-06 ENCOUNTER — Encounter (HOSPITAL_COMMUNITY): Payer: Self-pay

## 2016-11-06 ENCOUNTER — Ambulatory Visit (HOSPITAL_COMMUNITY)
Admission: RE | Admit: 2016-11-06 | Discharge: 2016-11-06 | Disposition: A | Discharge status: Home / Self Care | Payer: Self-pay | Source: Ambulatory Visit | Attending: Obstetrics & Gynecology | Admitting: Obstetrics & Gynecology

## 2016-11-06 DIAGNOSIS — O09293 Supervision of pregnancy with other poor reproductive or obstetric history, third trimester: Secondary | ICD-10-CM | POA: Insufficient documentation

## 2016-11-06 DIAGNOSIS — O09523 Supervision of elderly multigravida, third trimester: Secondary | ICD-10-CM | POA: Insufficient documentation

## 2016-11-06 DIAGNOSIS — Z3A31 31 weeks gestation of pregnancy: Secondary | ICD-10-CM | POA: Insufficient documentation

## 2016-11-07 LAB — COMPREHENSIVE METABOLIC PANEL
A/G RATIO: 1.3 (ref 1.2–2.2)
ALBUMIN: 3.7 g/dL (ref 3.5–5.5)
ALT: 12 IU/L (ref 0–32)
AST: 18 IU/L (ref 0–40)
Alkaline Phosphatase: 89 IU/L (ref 39–117)
BUN/Creatinine Ratio: 17 (ref 9–23)
BUN: 9 mg/dL (ref 6–20)
Bilirubin Total: 0.4 mg/dL (ref 0.0–1.2)
CALCIUM: 9.1 mg/dL (ref 8.7–10.2)
CO2: 21 mmol/L (ref 18–29)
Chloride: 99 mmol/L (ref 96–106)
Creatinine, Ser: 0.53 mg/dL — ABNORMAL LOW (ref 0.57–1.00)
GFR, EST AFRICAN AMERICAN: 141 mL/min/{1.73_m2} (ref >59)
GFR, EST NON AFRICAN AMERICAN: 123 mL/min/{1.73_m2} (ref >59)
GLUCOSE: 89 mg/dL (ref 65–99)
Globulin, Total: 2.8 g/dL (ref 1.5–4.5)
Potassium: 4.1 mmol/L (ref 3.5–5.2)
Sodium: 135 mmol/L (ref 134–144)
TOTAL PROTEIN: 6.5 g/dL (ref 6.0–8.5)

## 2016-11-07 LAB — LUPUS ANTICOAGULANT
DPT: 31.1 s (ref 0.0–55.0)
Dilute Viper Venom Time: 30.8 s (ref 0.0–47.0)
PTT Lupus Anticoagulant: 29.2 s (ref 0.0–51.9)
THROMBIN TIME: 14.2 s (ref 0.0–23.0)
dPT Confirm Ratio: 1.12 Ratio (ref 0.00–1.40)

## 2016-11-07 LAB — BILE ACIDS, TOTAL: BILE ACIDS TOTAL: 24.3 umol/L (ref 4.7–24.5)

## 2016-11-10 ENCOUNTER — Encounter: Payer: Self-pay | Admitting: Obstetrics and Gynecology

## 2016-11-12 ENCOUNTER — Encounter: Payer: Self-pay | Admitting: Obstetrics and Gynecology

## 2016-11-12 ENCOUNTER — Telehealth: Payer: Self-pay | Admitting: *Deleted

## 2016-11-12 DIAGNOSIS — A150 Tuberculosis of lung: Secondary | ICD-10-CM

## 2016-11-12 NOTE — Telephone Encounter (Signed)
[facility] She will fax over today clinic notes and sputum results. States patient was offered treatment- and opted to wait until after pregnancy.

## 2016-11-12 NOTE — Telephone Encounter (Signed)
Per message from Dr. Vergie LivingPickens need to follow up with health department to get sputum results and clinic notes from TB clinic.  I have called today and left a message with TB nurse Tammy at [phone] to call me back.

## 2016-11-12 NOTE — Progress Notes (Signed)
[facility] records reviewed. [facility] to tell her to call the TB clinic to start treatment (it looks like rifampin if her cbc w/ diff and cmp are negative) and will set her up for an MFM consult to review her treatment plan  Cornelia Copaharlie Mashal Slavick, Jr MD Attending [facility] 11/12/2016 Time: 1205pm

## 2016-11-13 ENCOUNTER — Telehealth: Payer: Self-pay | Admitting: *Deleted

## 2016-11-13 ENCOUNTER — Ambulatory Visit (HOSPITAL_COMMUNITY): Payer: Self-pay

## 2016-11-13 ENCOUNTER — Ambulatory Visit: Payer: Self-pay

## 2016-11-13 ENCOUNTER — Ambulatory Visit (INDEPENDENT_AMBULATORY_CARE_PROVIDER_SITE_OTHER): Payer: Self-pay | Admitting: Obstetrics & Gynecology

## 2016-11-13 VITALS — BP 94/59 | HR 78 | Wt 169.1 lb

## 2016-11-13 DIAGNOSIS — A150 Tuberculosis of lung: Secondary | ICD-10-CM

## 2016-11-13 DIAGNOSIS — O219 Vomiting of pregnancy, unspecified: Secondary | ICD-10-CM

## 2016-11-13 DIAGNOSIS — O26613 Liver and biliary tract disorders in pregnancy, third trimester: Secondary | ICD-10-CM

## 2016-11-13 DIAGNOSIS — O09293 Supervision of pregnancy with other poor reproductive or obstetric history, third trimester: Secondary | ICD-10-CM

## 2016-11-13 DIAGNOSIS — K831 Obstruction of bile duct: Secondary | ICD-10-CM

## 2016-11-13 DIAGNOSIS — O0993 Supervision of high risk pregnancy, unspecified, third trimester: Secondary | ICD-10-CM

## 2016-11-13 LAB — POCT URINALYSIS DIP (DEVICE)
Bilirubin Urine: NEGATIVE
GLUCOSE, UA: NEGATIVE mg/dL
Hgb urine dipstick: NEGATIVE
Ketones, ur: NEGATIVE mg/dL
Leukocytes, UA: NEGATIVE
NITRITE: NEGATIVE
PH: 7 (ref 5.0–8.0)
PROTEIN: NEGATIVE mg/dL
Specific Gravity, Urine: 1.02 (ref 1.005–1.030)
UROBILINOGEN UA: 0.2 mg/dL (ref 0.0–1.0)

## 2016-11-13 MED ORDER — FAMOTIDINE 20 MG PO TABS: 20.0000 mg | ORAL_TABLET | Freq: Two times a day (BID) | ORAL | 3 refills | Status: DC

## 2016-11-13 MED ORDER — METOCLOPRAMIDE HCL 10 MG PO TABS: 10.0000 mg | ORAL_TABLET | Freq: Four times a day (QID) | ORAL | 2 refills | Status: DC | PRN

## 2016-11-13 MED ORDER — HYDROXYZINE HCL 25 MG PO TABS: 25.0000 mg | ORAL_TABLET | Freq: Four times a day (QID) | ORAL | 3 refills | Status: DC | PRN

## 2016-11-13 MED ORDER — FAMOTIDINE 20 MG PO TABS: 20.0000 mg | ORAL_TABLET | Freq: Two times a day (BID) | ORAL | 3 refills | Status: AC

## 2016-11-13 NOTE — Progress Notes (Signed)
PRENATAL VISIT NOTE  Subjective:  Rebecca PlummerMaria Herminia [name] is a 36 y.o. [phone] at 3230w0d being seen today for ongoing prenatal care. Due to language barrier, a Spanish interpreter was present during the history-taking and subsequent discussion (and for part of the physical exam) with this patient.  She is currently monitored for the following issues for this high-risk pregnancy and has Supervision of high-risk pregnancy; Prior pregnancy with fetal demise at term, antepartum; Advanced maternal age in multigravida; Cholestasis during pregnancy in third trimester; History of oligohydramnios in prior pregnancy, currently pregnant; TB (pulmonary tuberculosis); and Language barrier on her problem list.  Patient reports nausea and vomiting.  Contractions: Irregular. Vag. Bleeding: None.  Movement: Present. Denies leaking of fluid.   The following portions of the patient's history were reviewed and updated as appropriate: allergies, current medications, past family history, past medical history, past social history, past surgical history and problem list. Problem list updated.  Objective:   Vitals:   11/13/16 0806  BP: (!) 94/59  Pulse: 78  Weight: 169 lb 1.6 oz (76.7 kg)   Fetal Status: Fetal Heart Rate (bpm): NST   Movement: Present     General:  Alert, oriented and cooperative. Patient is in no acute distress.  Skin: Skin is warm and dry. No rash noted.   Cardiovascular: Normal heart rate noted  Respiratory: Normal respiratory effort, no problems with respiration noted  Abdomen: Soft, gravid, appropriate for gestational age. Pain/Pressure: Present     Pelvic:  Cervical exam deferred        Extremities: Normal range of motion.  Edema: None  Mental Status: Normal mood and affect. Normal behavior. Normal judgment and thought content.   NST performed today was reviewed and was found to be reactive.  AFI was also normal.  Continue recommended antenatal testing and prenatal  care.  Assessment and Plan:  Pregnancy: [id number] at 4030w0d  1. Cholestasis during pregnancy in third trimester Continue recommended antenatal testing and prenatal care. Scans as per MFM. - Fetal nonstress test - [facility] - hydrOXYzine (ATARAX/VISTARIL) 25 MG tablet; Take 1 tablet (25 mg total) by mouth every 6 (six) hours as needed for itching.  Dispense: 30 tablet; Refill: 3  2. Prior pregnancy with fetal demise and current pregnancy in third trimester Reassuring AFI - Fetal nonstress test - [facility]  3. Nausea and vomiting during pregnancy Antiemetics and antacids prescribed. - famotidine (PEPCID) 20 MG tablet; Take 1 tablet (20 mg total) by mouth 2 (two) times daily.  Dispense: 60 tablet; Refill: 3 - metoCLOPramide (REGLAN) 10 MG tablet; Take 1 tablet (10 mg total) by mouth 4 (four) times daily as needed for nausea or vomiting.  Dispense: 30 tablet; Refill: 2  4. TB (pulmonary tuberculosis) Discussed results with patient; who is also undergoing treatment for ICP.  She defers treatment until after pregnancy given lack of active disease (negative cultures and negative CXR) and also rifampin's possible side effects on her liver (will possibly confound ICP's effects).  Will defer treatment until after pregnancy (was only recommended given [place]/o incarceration in [place] and being in country [place]  [place]2 years[place] See other 11/12/16 telephone notes in chart for more details.  5. Supervision of high risk pregnancy in third trimester Preterm labor symptoms and general obstetric precautions including but not limited to vaginal bleeding, contractions, leaking of fluid and fetal movement were reviewed in detail with the patient. Please refer to After Visit Summary for other counseling recommendations.  Return in about  4 days (around 11/17/2016) for as scheduled.    Jaynie Collins, MD

## 2016-11-13 NOTE — Patient Instructions (Signed)
Colestasis del embarazo °(Cholestasis of Pregnancy) °El término colestasis hace referencia a cualquier afección que provoca el enlentecimiento o la interrupción del flujo del líquido de la digestión (bilis) que el hígado fabrica. La colestasis del embarazo es más frecuente hacia el final del embarazo (tercertrimestre), pero puede presentarse en cualquier momento durante la gestación. Con frecuencia, la afección desaparece tan pronto como nace el bebé. °La colestasis puede causar molestias, pero a menudo no tiene efectos nocivos para usted; sin embargo, puede ser dañina para el bebé. La colestasis puede aumentar el riesgo de que el bebé nazca demasiado pronto (parto prematuro). °CAUSAS °La causa de la colestasis del embarazo no se conoce. Las hormonas del embarazo pueden afectar el funcionamiento de la vesícula biliar. Normalmente, la vesícula biliar contiene la bilis que el hígado fabrica hasta que se la necesita para ayudar a digerir las grasas de la dieta. Las hormonas del embarazo pueden enlentecer el flujo de la bilis y hacer que retroceda al hígado. Luego, la bilis ingresa al torrente sanguíneo y provoca síntomas de colestasis. °FACTORES DE RIESGO °Puede correr más riesgo si: °· Tuvo colestasis durante un embarazo anterior. °· Tiene antecedentes familiares de colestasis. °· Tiene problemas de hígado. °· Espera gemelos. °SIGNOS Y SÍNTOMAS °El síntoma más frecuente de la colestasis del embarazo es la picazón intensa, especialmente de las palmas de las manos y las plantas de los pies. La picazón puede extenderse al resto del cuerpo y suele ser peor por la noche. Generalmente, no tendrá una erupción cutánea. Otros síntomas son: °· Cansancio. °· Coloración amarillenta de la piel y la parte blanca de los ojos (ictericia). °· Orina de color oscuro. °· Heces de color claro. °· Pérdida del apetito. °DIAGNÓSTICO °El médico revisará sus antecedentes médicos y le hará un examen físico. Tal vez le hagan análisis de sangre  para controlar la función hepática, el nivel de bilis y de bilirrubina. °TRATAMIENTO °El objetivo del tratamiento es hacer que se sienta más cómoda y proteger al bebé. El médico puede recetar medicamentos para calmar la picazón. El medicamento que se usa también puede mejorar los resultados de los análisis de sangre y ayudar a proteger al bebé. Además, el médico puede darle vitamina K antes del parto para evitar el sangrado excesivo. °Es posible que el médico quiera controlar frecuentemente al bebé (monitoreo fetal), por ejemplo, cada 2 semanas. Una vez que los pulmones del bebé están suficientemente desarrollados, el médico puede recomendar el inicio (inducción) del trabajo de parto y el parto en la semana 37 de embarazo. °INSTRUCCIONES PARA EL CUIDADO EN EL HOGAR °· Use las cremas para aliviar la picazón y tome los medicamentos solamente como se lo haya indicado el médico. °· Tome baños de inmersión en agua fría para aliviar la picazón. °· Mantenga las uñas cortas para no irritar la piel al rascarse. °· Concurra a todas las visitas de monitoreo fetal. ° °SOLICITE ATENCIÓN MÉDICA SI: °Los síntomas empeoran a pesar del tratamiento. °SOLICITE ATENCIÓN MÉDICA DE INMEDIATO SI: °Comienza el trabajo de parto prematuro en su casa. °ASEGÚRESE DE QUE: °· Comprende estas instrucciones. °· Controlará su afección. °· Recibirá ayuda de inmediato si no mejora o si empeora. ° °Esta información no tiene como fin reemplazar el consejo del médico. Asegúrese de hacerle al médico cualquier pregunta que tenga. °Document Released: 03/26/2005 Document Revised: 07/07/2014 Document Reviewed: 04/08/2013 °Elsevier Interactive Patient Education © 2017 [employer] Inc[employer] ° °

## 2016-11-13 NOTE — Telephone Encounter (Signed)
Patient in office today for ob visit. Patient had discussion with Dr. Macon LargeAnyanwu- declined treatment , consult due to currently has cholestasis. Will cancel mfm consult per Dr. Macon LargeAnyanwu. Will notify Dr. Vergie LivingPickens of patient visit. Today.

## 2016-11-13 NOTE — Telephone Encounter (Signed)
Per message from Dr. Vergie LivingPickens need to schedule for stat MFM consult to go over her TB and treatment.  Also need to tell patient to start the rifampin as recommended by health department.

## 2016-11-13 NOTE — Progress Notes (Signed)
Interpreter [employer] present for encounter.  New diagnosis of Cholestasis discussed as well as twice weekly fetal testing per request of Dr. Vergie LivingPickens. Pt reports having nausea and vomiting of bitter, yellow liquid in the mornings.

## 2016-11-17 ENCOUNTER — Ambulatory Visit (INDEPENDENT_AMBULATORY_CARE_PROVIDER_SITE_OTHER): Payer: Self-pay | Admitting: Obstetrics & Gynecology

## 2016-11-17 VITALS — BP 98/54 | HR 74

## 2016-11-17 DIAGNOSIS — O09293 Supervision of pregnancy with other poor reproductive or obstetric history, third trimester: Secondary | ICD-10-CM

## 2016-11-17 DIAGNOSIS — O26613 Liver and biliary tract disorders in pregnancy, third trimester: Secondary | ICD-10-CM

## 2016-11-17 DIAGNOSIS — K831 Obstruction of bile duct: Secondary | ICD-10-CM

## 2016-11-17 NOTE — Progress Notes (Signed)
Interpreter Blanca Lindner present for encounter 

## 2016-11-19 ENCOUNTER — Other Ambulatory Visit: Payer: Self-pay | Admitting: Obstetrics and Gynecology

## 2016-11-19 ENCOUNTER — Encounter: Payer: Self-pay | Admitting: Obstetrics and Gynecology

## 2016-11-20 ENCOUNTER — Ambulatory Visit: Payer: Self-pay

## 2016-11-20 ENCOUNTER — Ambulatory Visit (INDEPENDENT_AMBULATORY_CARE_PROVIDER_SITE_OTHER): Payer: Self-pay | Admitting: Obstetrics and Gynecology

## 2016-11-20 VITALS — BP 100/61 | HR 78 | Wt 171.2 lb

## 2016-11-20 DIAGNOSIS — O09299 Supervision of pregnancy with other poor reproductive or obstetric history, unspecified trimester: Secondary | ICD-10-CM

## 2016-11-20 DIAGNOSIS — K831 Obstruction of bile duct: Secondary | ICD-10-CM

## 2016-11-20 DIAGNOSIS — O09523 Supervision of elderly multigravida, third trimester: Secondary | ICD-10-CM

## 2016-11-20 DIAGNOSIS — O26613 Liver and biliary tract disorders in pregnancy, third trimester: Secondary | ICD-10-CM

## 2016-11-20 DIAGNOSIS — O09293 Supervision of pregnancy with other poor reproductive or obstetric history, third trimester: Secondary | ICD-10-CM

## 2016-11-20 DIAGNOSIS — O0993 Supervision of high risk pregnancy, unspecified, third trimester: Secondary | ICD-10-CM

## 2016-11-20 DIAGNOSIS — Z789 Other specified health status: Secondary | ICD-10-CM

## 2016-11-20 DIAGNOSIS — A150 Tuberculosis of lung: Secondary | ICD-10-CM

## 2016-11-20 NOTE — Progress Notes (Signed)
Interpreter Erika present for encounter.   

## 2016-11-20 NOTE — Progress Notes (Signed)
Prenatal Visit Note Date: 11/20/2016 Clinic: [facility]  Subjective:  Rebecca Ibarra is a 36 y.o. [phone] at 4756w0d being seen today for ongoing prenatal care.  She is currently monitored for the following issues for this high-risk pregnancy and has Supervision of high-risk pregnancy; Prior pregnancy with fetal demise at term, antepartum; Advanced maternal age in multigravida; Cholestasis during pregnancy in third trimester; History of oligohydramnios in prior pregnancy, currently pregnant; TB (pulmonary tuberculosis); and Language barrier on her problem list.  Patient reports no complaints.   Contractions: Not present. Vag. Bleeding: None.  Movement: Present. Denies leaking of fluid.   The following portions of the patient's history were reviewed and updated as appropriate: allergies, current medications, past family history, past medical history, past social history, past surgical history and problem list. Problem list updated.  Objective:   Vitals:   11/20/16 0756  BP: 100/61  Pulse: 78  Weight: 171 lb 3.2 oz (77.7 kg)    Fetal Status: Fetal Heart Rate (bpm): NST   Movement: Present  Presentation: Vertex  General:  Alert, oriented and cooperative. Patient is in no acute distress.  Skin: Skin is warm and dry. No rash noted.   Cardiovascular: Normal heart rate noted  Respiratory: Normal respiratory effort, no problems with respiration noted  Abdomen: Soft, gravid, appropriate for gestational age. Pain/Pressure: Present     Pelvic:  Cervical exam deferred        Extremities: Normal range of motion.  Edema: None  Mental Status: Normal mood and affect. Normal behavior. Normal judgment and thought content.   Urinalysis:      Assessment and Plan:  Pregnancy: [id number] at 656w0d  1. Cholestasis during pregnancy in third trimester Pt states itching much improved. RNST, normal AFI, vertex today. Continue with 2x/ week testing. Has rpt growth next  week. No need to follow BA since getting delivered at 37wks anyways - Fetal nonstress test - [facility]  2. Prior pregnancy with fetal demise and current pregnancy in third trimester See above - Fetal nonstress test - [facility]  3. Supervision of high risk pregnancy in third trimester See above. nexplanon  4. Language barrier Interpreter used  5. Elderly multigravida in third trimester See above  6. TB (pulmonary tuberculosis) Defer treatment until PP per notes  7. History of oligohydramnios in prior pregnancy, currently pregnant See above  Preterm labor symptoms and general obstetric precautions including but not limited to vaginal bleeding, contractions, leaking of fluid and fetal movement were reviewed in detail with the patient. Please refer to After Visit Summary for other counseling recommendations.  Return in about 5 days (around 11/25/2016) for as scheduled.   Mertens BingPickens, Viktorya Arguijo, MD

## 2016-11-25 ENCOUNTER — Ambulatory Visit (INDEPENDENT_AMBULATORY_CARE_PROVIDER_SITE_OTHER): Payer: Self-pay | Admitting: Advanced Practice Midwife

## 2016-11-25 VITALS — BP 101/66 | HR 79

## 2016-11-25 DIAGNOSIS — O09293 Supervision of pregnancy with other poor reproductive or obstetric history, third trimester: Secondary | ICD-10-CM

## 2016-11-25 DIAGNOSIS — K831 Obstruction of bile duct: Secondary | ICD-10-CM

## 2016-11-25 DIAGNOSIS — O26613 Liver and biliary tract disorders in pregnancy, third trimester: Secondary | ICD-10-CM

## 2016-11-25 NOTE — Progress Notes (Signed)
Here for NST only. NST reactive. 

## 2016-11-25 NOTE — Progress Notes (Signed)
Interpreter Mariel Gallego present for encounter.   

## 2016-11-26 ENCOUNTER — Inpatient Hospital Stay (HOSPITAL_COMMUNITY)
Admission: AD | Admit: 2016-11-26 | Discharge: 2016-11-26 | Disposition: A | Discharge status: Home / Self Care | Payer: Self-pay | Source: Ambulatory Visit | Attending: Obstetrics and Gynecology | Admitting: Obstetrics and Gynecology

## 2016-11-26 ENCOUNTER — Encounter (HOSPITAL_COMMUNITY): Payer: Self-pay

## 2016-11-26 DIAGNOSIS — R0789 Other chest pain: Secondary | ICD-10-CM | POA: Insufficient documentation

## 2016-11-26 DIAGNOSIS — O9989 Other specified diseases and conditions complicating pregnancy, childbirth and the puerperium: Secondary | ICD-10-CM

## 2016-11-26 DIAGNOSIS — R112 Nausea with vomiting, unspecified: Secondary | ICD-10-CM | POA: Insufficient documentation

## 2016-11-26 DIAGNOSIS — Z3A33 33 weeks gestation of pregnancy: Secondary | ICD-10-CM | POA: Insufficient documentation

## 2016-11-26 DIAGNOSIS — K219 Gastro-esophageal reflux disease without esophagitis: Secondary | ICD-10-CM

## 2016-11-26 DIAGNOSIS — O99613 Diseases of the digestive system complicating pregnancy, third trimester: Secondary | ICD-10-CM | POA: Insufficient documentation

## 2016-11-26 DIAGNOSIS — O26893 Other specified pregnancy related conditions, third trimester: Secondary | ICD-10-CM | POA: Insufficient documentation

## 2016-11-26 HISTORY — DX: Gastro-esophageal reflux disease without esophagitis: K21.9

## 2016-11-26 LAB — URINALYSIS, ROUTINE W REFLEX MICROSCOPIC
BILIRUBIN URINE: NEGATIVE
GLUCOSE, UA: NEGATIVE mg/dL
HGB URINE DIPSTICK: NEGATIVE
KETONES UR: NEGATIVE mg/dL
Leukocytes, UA: NEGATIVE
Nitrite: NEGATIVE
PH: 5 (ref 5.0–8.0)
PROTEIN: NEGATIVE mg/dL
Specific Gravity, Urine: 1.023 (ref 1.005–1.030)

## 2016-11-26 MED ORDER — GI COCKTAIL ~~LOC~~
30.0000 mL | ORAL | Status: AC
  Administered 2016-11-26: 30 mL via ORAL
  Filled 2016-11-26: qty 30

## 2016-11-26 NOTE — MAU Note (Addendum)
Pt C/O chest pressure, worse on L, unable to take a deep breath, feels slightly better now than earlier this morning. It was painful earlier, now mostly pressure. Is worse with lying down. Denies contractions, bleeding or LOF.

## 2016-11-26 NOTE — MAU Provider Note (Signed)
History     CSN: [id number]  Arrival date and time: 11/26/16 0921   First Provider Initiated Contact with Patient 11/26/16 1113    **All assessments, history taking, physical exam, review of results and plan of care, and discharge instructions were interpreted by [facility].**  Chief Complaint  Patient presents with  . Chest Pain   HPI  [name] is a 36 yo [phone] at 33.[redacted] wks gestation presenting with complaints of pressure in her chest.  She says the pressure is more on the LT side.  She reports having pain with the pressure earlier, but none since arriving to hospital.  She reports the pressure increases when she lies down.  She also complains of N/V with phlegm.  She takes Pepcid and Tums for heartburn on a regular basis, but has not taken any today.  She ate chicken broth with veggies for lunch, Fried fish with cheese and tortillas for dinner and only a glass of milk this morning for breakfast.  She denies and fast heart rate or chest pain; only chest pressure.  She reports good FM all day today.  Past Medical History:  Diagnosis Date  . Medical history non-contributory     Past Surgical History:  Procedure Laterality Date  . APPENDECTOMY    . CHOLECYSTECTOMY  2015    Family History  Problem Relation Age of Onset  . Cancer Mother   . Heart disease Sister     Social History  Substance Use Topics  . Smoking status: Never Smoker  . Smokeless tobacco: Never Used  . Alcohol use No    Allergies: No Known Allergies  Prescriptions Prior to Admission  Medication Sig Dispense Refill Last Dose  . acetaminophen (TYLENOL) 325 MG tablet Take 650 mg by mouth every 6 (six) hours as needed for mild pain or headache.   11/26/2016 at Unknown time  . ASPIRIN PO Take 1 tablet by mouth daily.   11/26/2016 at Unknown time  . famotidine (PEPCID) 20 MG tablet Take 1 tablet (20 mg total) by mouth 2 (two) times daily. 60 tablet 3 11/25/2016 at Unknown time  . hydrOXYzine  (ATARAX/VISTARIL) 25 MG tablet Take 1 tablet (25 mg total) by mouth every 6 (six) hours as needed for itching. 30 tablet 3 11/25/2016 at Unknown time  . metoCLOPramide (REGLAN) 10 MG tablet Take 1 tablet (10 mg total) by mouth 4 (four) times daily as needed for nausea or vomiting. 30 tablet 2 11/26/2016 at Unknown time  . Prenatal Vit-Fe Fumarate-FA (PRENATAL MULTIVITAMIN) TABS tablet Take 1 tablet by mouth daily at 12 noon.   11/25/2016 at Unknown time  . ursodiol (ACTIGALL) 500 MG tablet Take 1 tablet (500 mg total) by mouth 2 (two) times daily. 60 tablet 3 11/25/2016 at Unknown time    Review of Systems  Constitutional: Negative.   HENT: Negative.   Respiratory: Positive for shortness of breath (difficulty taking deep breath earlier, but not now).   Cardiovascular:       "pressure"  Gastrointestinal: Positive for nausea and vomiting.       Heartburn  Endocrine: Negative.   Genitourinary: Negative.   Musculoskeletal: Negative.   Skin: Negative.   Allergic/Immunologic: Negative.   Neurological: Negative.   Hematological: Negative.   Psychiatric/Behavioral: Negative.    Physical Exam   Blood pressure 105/64, pulse 77, temperature 97.6 F (36.4 C), temperature source Oral, resp. rate 18, last menstrual period 04/03/2016, SpO2 97 %, unknown if currently breastfeeding.  Physical Exam  Constitutional: She is oriented to person, place, and time. She appears well-developed and well-nourished.  HENT:  Head: Normocephalic.  Eyes: Pupils are equal, round, and reactive to light.  Neck: Normal range of motion.  Cardiovascular: Normal rate, regular rhythm, normal heart sounds and intact distal pulses.   No increased pain or pressure with palpation of chest/sternal area  Respiratory: Effort normal and breath sounds normal.  GI: Soft. Bowel sounds are normal.  No tenderness with palpation; no epigastric pain  Genitourinary:  Genitourinary Comments: Uterus: gravid, S=D, pelvic deferred   Musculoskeletal: Normal range of motion.  Neurological: She is alert and oriented to person, place, and time. She has normal reflexes.  Skin: Skin is warm and dry.  Psychiatric: She has a normal mood and affect. Her behavior is normal. Judgment and thought content normal.     Results for orders placed or performed during the hospital encounter of 11/26/16 (from the past 24 hour(s))  Urinalysis, Routine w reflex microscopic     Status: Abnormal   Collection Time: 11/26/16  9:54 AM  Result Value Ref Range   Color, Urine YELLOW YELLOW   APPearance HAZY (A) CLEAR   Specific Gravity, Urine 1.023 1.005 - 1.030   pH 5.0 5.0 - 8.0   Glucose, UA NEGATIVE NEGATIVE mg/dL   Hgb urine dipstick NEGATIVE NEGATIVE   Bilirubin Urine NEGATIVE NEGATIVE   Ketones, ur NEGATIVE NEGATIVE mg/dL   Protein, ur NEGATIVE NEGATIVE mg/dL   Nitrite NEGATIVE NEGATIVE   Leukocytes, UA NEGATIVE NEGATIVE   CEFM  FHR: 130 bpm / moderate variability / accels present / decels absent TOCO: Infrequent UC's and UI  MAU Course  Procedures  MDM [facility] EKG - NSR, no abnormalities G.I. Cocktail - "pain much better after taking"  Assessment and Plan  Gastroesophageal reflux disease without esophagitis - Continue taking Pepcid and Tums - Educated to take Maalox for heartburn not relieved with above medications - Maintain good hydration - at least 64 oz of water daily  Discharge home Keep scheduled appt with [facility] 11/27/2016 Patient verbalized an understanding of the plan of care and agrees.   Raelyn Mora MSN, CNM 11/26/2016, 1:05 PM

## 2016-11-27 ENCOUNTER — Ambulatory Visit (INDEPENDENT_AMBULATORY_CARE_PROVIDER_SITE_OTHER): Payer: Self-pay | Admitting: Obstetrics & Gynecology

## 2016-11-27 ENCOUNTER — Ambulatory Visit (HOSPITAL_COMMUNITY)
Admission: RE | Admit: 2016-11-27 | Discharge: 2016-11-27 | Disposition: A | Discharge status: Home / Self Care | Payer: Self-pay | Source: Ambulatory Visit | Attending: Obstetrics & Gynecology | Admitting: Obstetrics & Gynecology

## 2016-11-27 ENCOUNTER — Encounter (HOSPITAL_COMMUNITY): Payer: Self-pay

## 2016-11-27 ENCOUNTER — Other Ambulatory Visit (HOSPITAL_COMMUNITY): Payer: Self-pay | Admitting: Maternal and Fetal Medicine

## 2016-11-27 VITALS — BP 106/61 | HR 88 | Wt 174.3 lb

## 2016-11-27 DIAGNOSIS — Z3A34 34 weeks gestation of pregnancy: Secondary | ICD-10-CM | POA: Insufficient documentation

## 2016-11-27 DIAGNOSIS — K219 Gastro-esophageal reflux disease without esophagitis: Secondary | ICD-10-CM

## 2016-11-27 DIAGNOSIS — O0993 Supervision of high risk pregnancy, unspecified, third trimester: Secondary | ICD-10-CM

## 2016-11-27 DIAGNOSIS — O26613 Liver and biliary tract disorders in pregnancy, third trimester: Secondary | ICD-10-CM | POA: Insufficient documentation

## 2016-11-27 DIAGNOSIS — O09523 Supervision of elderly multigravida, third trimester: Secondary | ICD-10-CM | POA: Insufficient documentation

## 2016-11-27 DIAGNOSIS — O09293 Supervision of pregnancy with other poor reproductive or obstetric history, third trimester: Secondary | ICD-10-CM

## 2016-11-27 DIAGNOSIS — K831 Obstruction of bile duct: Secondary | ICD-10-CM | POA: Insufficient documentation

## 2016-11-27 MED ORDER — OMEPRAZOLE 20 MG PO CPDR: 20.0000 mg | DELAYED_RELEASE_CAPSULE | Freq: Every day | ORAL | Status: DC

## 2016-11-27 MED ORDER — CETIRIZINE HCL 10 MG PO TABS: 10.0000 mg | ORAL_TABLET | Freq: Every day | ORAL | Status: DC

## 2016-11-27 NOTE — Progress Notes (Signed)
Interpreter Marchelle Folksmanda present for encounter.  Pt reports having itching in her throat and coughing up green phlegm.  Pt had MAU visit yesterday due to difficulty breathing and heaviness in her chest and body. US for growth today. Pt advised that she may try Omeprazole 20 mg OTC once daily in place of the Pepcid for treatment of acid reflux. She may continue to take Tums and Maalox prn as well. Pt voiced understanding.

## 2016-11-27 NOTE — Progress Notes (Signed)
Patient ID: Rebecca Ibarra, female   DOB: 03/24/1981, 36 y.o.   MRN: [id number]   PRENATAL VISIT NOTE  Subjective:  Rebecca PlummerMaria Herminia [name] is a 36 y.o. [phone] at 2923w0d being seen today for ongoing prenatal care.  She is currently monitored for the following issues for this high-risk pregnancy and has Supervision of high-risk pregnancy; Prior pregnancy with fetal demise at term, antepartum; Advanced maternal age in multigravida; Cholestasis during pregnancy in third trimester; History of oligohydramnios in prior pregnancy, currently pregnant; TB (pulmonary tuberculosis); Language barrier; and GERD (gastroesophageal reflux disease) on her problem list.  Patient reports rhinorrhea, throat irritation.  no fever, chills, no cough.  Contractions: Irregular. Vag. Bleeding: None.  Movement: Present. Denies leaking of fluid.   The following portions of the patient's history were reviewed and updated as appropriate: allergies, current medications, past family history, past medical history, past social history, past surgical history and problem list. Problem list updated.  Objective:   Vitals:   11/27/16 0748  BP: 106/61  Pulse: 88  Weight: 174 lb 4.8 oz (79.1 kg)    Fetal Status: Fetal Heart Rate (bpm): NST   Movement: Present     General:  Alert, oriented and cooperative. Patient is in no acute distress.  Skin: Skin is warm and dry. No rash noted.   Cardiovascular: Normal heart rate noted  Respiratory: Normal respiratory effort, no problems with respiration noted  Abdomen: Soft, gravid, appropriate for gestational age. Pain/Pressure: Present     Pelvic:  Cervical exam deferred        Extremities: Normal range of motion.  Edema: None  Mental Status: Normal mood and affect. Normal behavior. Normal judgment and thought content.   Assessment and Plan:  Pregnancy: [id number] at 3923w0d  1. Prior pregnancy with fetal demise and current pregnancy in third trimester - Fetal  nonstress test--Reactive  2. Supervision of high risk pregnancy in third trimester -Pt seems to have allergies with rhinorrhea nd throat irritation.  Sgtart generic zyrtec.  Will re-evaluate at next visit if not beter.  NO CP, SOB today.  Lungs CTAB  3. Cholestasis during pregnancy in third trimester - Fetal nonstress test - Induction at 37 weeks  4. Gastroesophageal reflux disease, esophagitis presence not specified -Will change meds to ppi (prilosic).  If pt can't afford, can continue tums, zantac and reglan.  Preterm labor symptoms and general obstetric precautions including but not limited to vaginal bleeding, contractions, leaking of fluid and fetal movement were reviewed in detail with the patient. Please refer to After Visit Summary for other counseling recommendations.  No Follow-up on file.   Elsie LincolnKelly Kordel Leavy, MD

## 2016-12-01 ENCOUNTER — Ambulatory Visit (INDEPENDENT_AMBULATORY_CARE_PROVIDER_SITE_OTHER): Payer: Self-pay | Admitting: Obstetrics & Gynecology

## 2016-12-01 VITALS — BP 102/59 | HR 66

## 2016-12-01 DIAGNOSIS — O09293 Supervision of pregnancy with other poor reproductive or obstetric history, third trimester: Secondary | ICD-10-CM

## 2016-12-01 DIAGNOSIS — O26643 Intrahepatic cholestasis of pregnancy, third trimester: Secondary | ICD-10-CM

## 2016-12-01 DIAGNOSIS — O26613 Liver and biliary tract disorders in pregnancy, third trimester: Secondary | ICD-10-CM

## 2016-12-01 DIAGNOSIS — K831 Obstruction of bile duct: Secondary | ICD-10-CM

## 2016-12-01 NOTE — Progress Notes (Signed)
Patient ID: Rebecca Ibarra, female   DOB: 02/04/1981, 36 y.o.   MRN: [id number]  NST reactive today.  Elsie LincolnKelly Nakhia Levitan

## 2016-12-03 ENCOUNTER — Encounter: Payer: Self-pay | Admitting: Obstetrics and Gynecology

## 2016-12-04 ENCOUNTER — Ambulatory Visit (INDEPENDENT_AMBULATORY_CARE_PROVIDER_SITE_OTHER): Payer: Self-pay | Admitting: Obstetrics & Gynecology

## 2016-12-04 ENCOUNTER — Ambulatory Visit: Payer: Self-pay

## 2016-12-04 VITALS — Wt 173.8 lb

## 2016-12-04 DIAGNOSIS — O0993 Supervision of high risk pregnancy, unspecified, third trimester: Secondary | ICD-10-CM

## 2016-12-04 DIAGNOSIS — K831 Obstruction of bile duct: Secondary | ICD-10-CM

## 2016-12-04 DIAGNOSIS — O09293 Supervision of pregnancy with other poor reproductive or obstetric history, third trimester: Secondary | ICD-10-CM

## 2016-12-04 DIAGNOSIS — O26643 Intrahepatic cholestasis of pregnancy, third trimester: Secondary | ICD-10-CM

## 2016-12-04 DIAGNOSIS — O26613 Liver and biliary tract disorders in pregnancy, third trimester: Secondary | ICD-10-CM

## 2016-12-04 DIAGNOSIS — Z113 Encounter for screening for infections with a predominantly sexual mode of transmission: Secondary | ICD-10-CM

## 2016-12-04 NOTE — Progress Notes (Signed)
Pt informed that the ultrasound is considered a limited OB ultrasound and is not intended to be a complete ultrasound exam.  Patient also informed that the ultrasound is not being completed with the intent of assessing for fetal or placental anomalies or any pelvic abnormalities.  Explained that the purpose of today's ultrasound is to assess for presentation and amniotic fluid volume.  Patient acknowledges the purpose of the exam and the limitations of the study.    

## 2016-12-04 NOTE — Progress Notes (Signed)
NST reactive today and nl AFI, breech    PRENATAL VISIT NOTE  Subjective:  Rebecca Ibarra is a 36 y.o. [phone] at 3251w0d being seen today for ongoing prenatal care.  She is currently monitored for the following issues for this high-risk pregnancy and has Supervision of high-risk pregnancy; Prior pregnancy with fetal demise at term, antepartum; Advanced maternal age in multigravida; Cholestasis during pregnancy in third trimester; History of oligohydramnios in prior pregnancy, currently pregnant; TB (pulmonary tuberculosis); Language barrier; and GERD (gastroesophageal reflux disease) on her problem list.  Patient reports occasional contractions.  Contractions: Irregular. Vag. Bleeding: None.  Movement: Present. Denies leaking of fluid.   The following portions of the patient's history were reviewed and updated as appropriate: allergies, current medications, past family history, past medical history, past social history, past surgical history and problem list. Problem list updated.  Objective:   Vitals:   12/04/16 0818  Weight: 173 lb 12.8 oz (78.8 kg)    Fetal Status: Fetal Heart Rate (bpm): NST   Movement: Present  Presentation: Homero FellersFrank Breech  General:  Alert, oriented and cooperative. Patient is in no acute distress.  Skin: Skin is warm and dry. No rash noted.   Cardiovascular: Normal heart rate noted  Respiratory: Normal respiratory effort, no problems with respiration noted  Abdomen: Soft, gravid, appropriate for gestational age. Pain/Pressure: Present     Pelvic:  Cervical exam performed        Extremities: Normal range of motion.  Edema: None  Mental Status: Normal mood and affect. Normal behavior. Normal judgment and thought content.   Assessment and Plan:  Pregnancy: [id number] at 1651w0d  1. Prior pregnancy with fetal demise and current pregnancy in third trimester Good fetal surveillanc - Fetal nonstress test - US OB Limited - Culture, beta strep (group b  only)  2. Cholestasis during pregnancy in third trimester  - Fetal nonstress test - US OB Limited - Culture, beta strep (group b only)  3. Supervision of high risk pregnancy in third trimester  - [facility]lam[facility] - Culture, beta strep (group b only)  Preterm labor symptoms and general obstetric precautions including but not limited to vaginal bleeding, contractions, leaking of fluid and fetal movement were reviewed in detail with the patient. Please refer to After Visit Summary for other counseling recommendations.  Return in about 7 days (around 12/11/2016) for Ob fu and NST/AFI.   Scheryl DarterJames Labria Wos, MD

## 2016-12-05 LAB — GC/CHLAMYDIA PROBE AMP (~~LOC~~) NOT AT ARMC
Chlamydia: NEGATIVE
NEISSERIA GONORRHEA: NEGATIVE

## 2016-12-08 ENCOUNTER — Ambulatory Visit (INDEPENDENT_AMBULATORY_CARE_PROVIDER_SITE_OTHER): Payer: Self-pay | Admitting: Obstetrics & Gynecology

## 2016-12-08 DIAGNOSIS — O26613 Liver and biliary tract disorders in pregnancy, third trimester: Secondary | ICD-10-CM

## 2016-12-08 DIAGNOSIS — O09293 Supervision of pregnancy with other poor reproductive or obstetric history, third trimester: Secondary | ICD-10-CM

## 2016-12-08 DIAGNOSIS — K831 Obstruction of bile duct: Secondary | ICD-10-CM

## 2016-12-08 DIAGNOSIS — O26643 Intrahepatic cholestasis of pregnancy, third trimester: Secondary | ICD-10-CM

## 2016-12-08 LAB — CULTURE, BETA STREP (GROUP B ONLY): STREP GP B CULTURE: NEGATIVE

## 2016-12-10 NOTE — Patient Instructions (Signed)
Third Trimester of Pregnancy The third trimester is from week 28 through week 40 (months 7 through 9). The third trimester is a time when the unborn baby (fetus) is growing rapidly. At the end of the ninth month, the fetus is about 20 inches in length and weighs 6-10 pounds. Body changes during your third trimester Your body will continue to go through many changes during pregnancy. The changes vary from woman to woman. During the third trimester:  Your weight will continue to increase. You can expect to gain 25-35 pounds (11-16 kg) by the end of the pregnancy.  You may begin to get stretch marks on your hips, abdomen, and breasts.  You may urinate more often because the fetus is moving lower into your pelvis and pressing on your bladder.  You may develop or continue to have heartburn. This is caused by increased hormones that slow down muscles in the digestive tract.  You may develop or continue to have constipation because increased hormones slow digestion and cause the muscles that push waste through your intestines to relax.  You may develop hemorrhoids. These are swollen veins (varicose veins) in the rectum that can itch or be painful.  You may develop swollen, bulging veins (varicose veins) in your legs.  You may have increased body aches in the pelvis, back, or thighs. This is due to weight gain and increased hormones that are relaxing your joints.  You may have changes in your hair. These can include thickening of your hair, rapid growth, and changes in texture. Some women also have hair loss during or after pregnancy, or hair that feels dry or thin. Your hair will most likely return to normal after your baby is born.  Your breasts will continue to grow and they will continue to become tender. A yellow fluid (colostrum) may leak from your breasts. This is the first milk you are producing for your baby.  Your belly button may stick out.  You may notice more swelling in your hands,  face, or ankles.  You may have increased tingling or numbness in your hands, arms, and legs. The skin on your belly may also feel numb.  You may feel short of breath because of your expanding uterus.  You may have more problems sleeping. This can be caused by the size of your belly, increased need to urinate, and an increase in your body's metabolism.  You may notice the fetus "dropping," or moving lower in your abdomen (lightening).  You may have increased vaginal discharge.  You may notice your joints feel loose and you may have pain around your pelvic bone.  What to expect at prenatal visits You will have prenatal exams every 2 weeks until week 36. Then you will have weekly prenatal exams. During a routine prenatal visit:  You will be weighed to make sure you and the baby are growing normally.  Your blood pressure will be taken.  Your abdomen will be measured to track your baby's growth.  The fetal heartbeat will be listened to.  Any test results from the previous visit will be discussed.  You may have a cervical check near your due date to see if your cervix has softened or thinned (effaced).  You will be tested for Group B streptococcus. This happens between 35 and 37 weeks.  Your health care provider may ask you:  What your birth plan is.  How you are feeling.  If you are feeling the baby move.  If you have had   any abnormal symptoms, such as leaking fluid, bleeding, severe headaches, or abdominal cramping.  If you are using any tobacco products, including cigarettes, chewing tobacco, and electronic cigarettes.  If you have any questions.  Other tests or screenings that may be performed during your third trimester include:  Blood tests that check for low iron levels (anemia).  Fetal testing to check the health, activity level, and growth of the fetus. Testing is done if you have certain medical conditions or if there are problems during the  pregnancy.  Nonstress test (NST). This test checks the health of your baby to make sure there are no signs of problems, such as the baby not getting enough oxygen. During this test, a belt is placed around your belly. The baby is made to move, and its heart rate is monitored during movement.  What is false labor? False labor is a condition in which you feel small, irregular tightenings of the muscles in the womb (contractions) that usually go away with rest, changing position, or drinking water. These are called Braxton Hicks contractions. Contractions may last for hours, days, or even weeks before true labor sets in. If contractions come at regular intervals, become more frequent, increase in intensity, or become painful, you should see your health care provider. What are the signs of labor?  Abdominal cramps.  Regular contractions that start at 10 minutes apart and become stronger and more frequent with time.  Contractions that start on the top of the uterus and spread down to the lower abdomen and back.  Increased pelvic pressure and dull back pain.  A watery or bloody mucus discharge that comes from the vagina.  Leaking of amniotic fluid. This is also known as your "water breaking." It could be a slow trickle or a gush. Let your health care provider know if it has a color or strange odor. If you have any of these signs, call your health care provider right away, even if it is before your due date. Follow these instructions at home: Medicines  Follow your health care provider's instructions regarding medicine use. Specific medicines may be either safe or unsafe to take during pregnancy.  Take a prenatal vitamin that contains at least 600 micrograms (mcg) of folic acid.  If you develop constipation, try taking a stool softener if your health care provider approves. Eating and drinking  Eat a balanced diet that includes fresh fruits and vegetables, whole grains, good sources of protein  such as meat, eggs, or tofu, and low-fat dairy. Your health care provider will help you determine the amount of weight gain that is right for you.  Avoid raw meat and uncooked cheese. These carry germs that can cause birth defects in the baby.  If you have low calcium intake from food, talk to your health care provider about whether you should take a daily calcium supplement.  Eat four or five small meals rather than three large meals a day.  Limit foods that are high in fat and processed sugars, such as fried and sweet foods.  To prevent constipation: ? Drink enough fluid to keep your urine clear or pale yellow. ? Eat foods that are high in fiber, such as fresh fruits and vegetables, whole grains, and beans. Activity  Exercise only as directed by your health care provider. Most women can continue their usual exercise routine during pregnancy. Try to exercise for 30 minutes at least 5 days a week. Stop exercising if you experience uterine contractions.  Avoid heavy   lifting.  Do not exercise in extreme heat or humidity, or at high altitudes.  Wear low-heel, comfortable shoes.  Practice good posture.  You may continue to have sex unless your health care provider tells you otherwise. Relieving pain and discomfort  Take frequent breaks and rest with your legs elevated if you have leg cramps or low back pain.  Take warm sitz baths to soothe any pain or discomfort caused by hemorrhoids. Use hemorrhoid cream if your health care provider approves.  Wear a good support bra to prevent discomfort from breast tenderness.  If you develop varicose veins: ? Wear support pantyhose or compression stockings as told by your healthcare provider. ? Elevate your feet for 15 minutes, 3-4 times a day. Prenatal care  Write down your questions. Take them to your prenatal visits.  Keep all your prenatal visits as told by your health care provider. This is important. Safety  Wear your seat belt at  all times when driving.  Make a list of emergency phone numbers, including numbers for family, friends, the hospital, and police and fire departments. General instructions  Avoid cat litter boxes and soil used by cats. These carry germs that can cause birth defects in the baby. If you have a cat, ask someone to clean the litter box for you.  Do not travel far distances unless it is absolutely necessary and only with the approval of your health care provider.  Do not use hot tubs, steam rooms, or saunas.  Do not drink alcohol.  Do not use any products that contain nicotine or tobacco, such as cigarettes and e-cigarettes. If you need help quitting, ask your health care provider.  Do not use any medicinal herbs or unprescribed drugs. These chemicals affect the formation and growth of the baby.  Do not douche or use tampons or scented sanitary pads.  Do not cross your legs for long periods of time.  To prepare for the arrival of your baby: ? Take prenatal classes to understand, practice, and ask questions about labor and delivery. ? Make a trial run to the hospital. ? Visit the hospital and tour the maternity area. ? Arrange for maternity or paternity leave through employers. ? Arrange for family and friends to take care of pets while you are in the hospital. ? Purchase a rear-facing car seat and make sure you know how to install it in your car. ? Pack your hospital bag. ? Prepare the baby's nursery. Make sure to remove all pillows and stuffed animals from the baby's crib to prevent suffocation.  Visit your dentist if you have not gone during your pregnancy. Use a soft toothbrush to brush your teeth and be gentle when you floss. Contact a health care provider if:  You are unsure if you are in labor or if your water has broken.  You become dizzy.  You have mild pelvic cramps, pelvic pressure, or nagging pain in your abdominal area.  You have lower back pain.  You have persistent  nausea, vomiting, or diarrhea.  You have an unusual or bad smelling vaginal discharge.  You have pain when you urinate. Get help right away if:  Your water breaks before 37 weeks.  You have regular contractions less than 5 minutes apart before 37 weeks.  You have a fever.  You are leaking fluid from your vagina.  You have spotting or bleeding from your vagina.  You have severe abdominal pain or cramping.  You have rapid weight loss or weight gain.    You have shortness of breath with chest pain.  You notice sudden or extreme swelling of your face, hands, ankles, feet, or legs.  Your baby makes fewer than 10 movements in 2 hours.  You have severe headaches that do not go away when you take medicine.  You have vision changes. Summary  The third trimester is from week 28 through week 40, months 7 through 9. The third trimester is a time when the unborn baby (fetus) is growing rapidly.  During the third trimester, your discomfort may increase as you and your baby continue to gain weight. You may have abdominal, leg, and back pain, sleeping problems, and an increased need to urinate.  During the third trimester your breasts will keep growing and they will continue to become tender. A yellow fluid (colostrum) may leak from your breasts. This is the first milk you are producing for your baby.  False labor is a condition in which you feel small, irregular tightenings of the muscles in the womb (contractions) that eventually go away. These are called Braxton Hicks contractions. Contractions may last for hours, days, or even weeks before true labor sets in.  Signs of labor can include: abdominal cramps; regular contractions that start at 10 minutes apart and become stronger and more frequent with time; watery or bloody mucus discharge that comes from the vagina; increased pelvic pressure and dull back pain; and leaking of amniotic fluid. This information is not intended to replace advice  given to you by your health care provider. Make sure you discuss any questions you have with your health care provider. Document Released: 06/10/2001 Document Revised: 11/22/2015 Document Reviewed: 08/17/2012 [employer] Interactive Patient Education  2017 [employer]  

## 2016-12-11 ENCOUNTER — Other Ambulatory Visit: Payer: Self-pay | Admitting: Obstetrics and Gynecology

## 2016-12-11 ENCOUNTER — Ambulatory Visit: Payer: Self-pay

## 2016-12-11 ENCOUNTER — Ambulatory Visit (INDEPENDENT_AMBULATORY_CARE_PROVIDER_SITE_OTHER): Payer: Self-pay | Admitting: Obstetrics and Gynecology

## 2016-12-11 VITALS — BP 100/67 | HR 90 | Wt 172.7 lb

## 2016-12-11 DIAGNOSIS — O09293 Supervision of pregnancy with other poor reproductive or obstetric history, third trimester: Secondary | ICD-10-CM

## 2016-12-11 DIAGNOSIS — K831 Obstruction of bile duct: Secondary | ICD-10-CM

## 2016-12-11 DIAGNOSIS — O320XX0 Maternal care for unstable lie, not applicable or unspecified: Secondary | ICD-10-CM

## 2016-12-11 DIAGNOSIS — A150 Tuberculosis of lung: Secondary | ICD-10-CM

## 2016-12-11 DIAGNOSIS — Z789 Other specified health status: Secondary | ICD-10-CM

## 2016-12-11 DIAGNOSIS — O0993 Supervision of high risk pregnancy, unspecified, third trimester: Secondary | ICD-10-CM

## 2016-12-11 DIAGNOSIS — O26613 Liver and biliary tract disorders in pregnancy, third trimester: Secondary | ICD-10-CM

## 2016-12-11 NOTE — Progress Notes (Signed)
Prenatal Visit Note Date: 12/11/2016 Clinic: [facility]  Subjective:  Rebecca Ibarra is a 36 y.o. [phone] at 6367w0d being seen today for ongoing prenatal care.  She is currently monitored for the following issues for this high-risk pregnancy and has Supervision of high-risk pregnancy; Prior pregnancy with fetal demise at term, antepartum; Advanced maternal age in multigravida; Cholestasis during pregnancy in third trimester; History of oligohydramnios in prior pregnancy, currently pregnant; TB (pulmonary tuberculosis); Language barrier; GERD (gastroesophageal reflux disease); and Unstable lie of fetus on her problem list.  Patient reports no complaints.   Contractions: Irregular. Vag. Bleeding: None.  Movement: Present. Denies leaking of fluid.   The following portions of the patient's history were reviewed and updated as appropriate: allergies, current medications, past family history, past medical history, past social history, past surgical history and problem list. Problem list updated.  Objective:   Vitals:   12/11/16 1245  BP: 100/67  Pulse: 90  Weight: 172 lb 11.2 oz (78.3 kg)    Fetal Status: Fetal Heart Rate (bpm): NST   Movement: Present  Presentation: Vertex  General:  Alert, oriented and cooperative. Patient is in no acute distress.  Skin: Skin is warm and dry. No rash noted.   Cardiovascular: Normal heart rate noted  Respiratory: Normal respiratory effort, no problems with respiration noted  Abdomen: Soft, gravid, appropriate for gestational age. Pain/Pressure: Present     Pelvic:  Cervical exam deferred        Extremities: Normal range of motion.  Edema: None  Mental Status: Normal mood and affect. Normal behavior. Normal judgment and thought content.   Urinalysis:      Assessment and Plan:  Pregnancy: [id number] at 7367w0d  1. Prior pregnancy with fetal demise and current pregnancy in third trimester See below.  - Fetal nonstress  test - US OB Limited  2. Cholestasis during pregnancy in third trimester No issues. Set up for 37wk IOL. rNST today (135 baseline, +accels, no decels, mod variability, one UC). Normal AFI today - Fetal nonstress test - US OB Limited  3. Supervision of high risk pregnancy in third trimester No change in plan of care  4. Unstable fetal lie, single or unspecified fetus Cephalic today  5. Language barrier Interpreter used  6. TB (pulmonary tuberculosis) Deferring tx until PP  Preterm labor symptoms and general obstetric precautions including but not limited to vaginal bleeding, contractions, leaking of fluid and fetal movement were reviewed in detail with the patient. Please refer to After Visit Summary for other counseling recommendations.  Return in about 5 weeks (around 01/15/2017) for PP visit.  IOL on 6/22.   [place] BingPickens, Chrishawna Farina, MD

## 2016-12-11 NOTE — Progress Notes (Signed)
Pt informed that the ultrasound is considered a limited OB ultrasound and is not intended to be a complete ultrasound exam.  Patient also informed that the ultrasound is not being completed with the intent of assessing for fetal or placental anomalies or any pelvic abnormalities.  Explained that the purpose of today's ultrasound is to assess for presentation and amniotic fluid volume.  Patient acknowledges the purpose of the exam and the limitations of the study.   Scheduled for IOL on 6/22 @ 0700.

## 2016-12-15 ENCOUNTER — Ambulatory Visit (INDEPENDENT_AMBULATORY_CARE_PROVIDER_SITE_OTHER): Payer: Self-pay | Admitting: Obstetrics & Gynecology

## 2016-12-15 VITALS — BP 102/64 | HR 90

## 2016-12-15 DIAGNOSIS — O09293 Supervision of pregnancy with other poor reproductive or obstetric history, third trimester: Secondary | ICD-10-CM

## 2016-12-15 DIAGNOSIS — O26613 Liver and biliary tract disorders in pregnancy, third trimester: Secondary | ICD-10-CM

## 2016-12-15 DIAGNOSIS — K831 Obstruction of bile duct: Secondary | ICD-10-CM

## 2016-12-15 LAB — FETAL NONSTRESS TEST

## 2016-12-16 NOTE — Patient Instructions (Signed)
Tercer trimestre de embarazo (Third Trimester of Pregnancy) El tercer trimestre comprende desde la semana29 hasta la semana42, es decir, desde el mes7 hasta el mes9. El tercer trimestre es un perodo en el que el feto crece rpidamente. Hacia el final del noveno mes, el feto mide alrededor de 20pulgadas (45cm) de largo y pesa entre 6 y 10 libras (2,700 y 4,500kg). CAMBIOS EN EL ORGANISMO Su organismo atraviesa por muchos cambios durante el embarazo, y estos varan de una mujer a otra.  Seguir aumentando de peso. Es de esperar que aumente entre 25 y 35libras (11 y 16kg) hacia el final del embarazo.  Podrn aparecer las primeras estras en las caderas, el abdomen y las mamas.  Puede tener necesidad de orinar con ms frecuencia porque el feto baja hacia la pelvis y ejerce presin sobre la vejiga.  Debido al embarazo podr sentir acidez estomacal con frecuencia.  Puede estar estreida, ya que ciertas hormonas enlentecen los movimientos de los msculos que empujan los desechos a travs de los intestinos.  Pueden aparecer hemorroides o abultarse e hincharse las venas (venas varicosas).  Puede sentir dolor plvico debido al aumento de peso y a que las hormonas del embarazo relajan las articulaciones entre los huesos de la pelvis. El dolor de espalda puede ser consecuencia de la sobrecarga de los msculos que soportan la postura.  Tal vez haya cambios en el cabello que pueden incluir su engrosamiento, crecimiento rpido y cambios en la textura. Adems, a algunas mujeres se les cae el cabello durante o despus del embarazo, o tienen el cabello seco o fino. Lo ms probable es que el cabello se le normalice despus del nacimiento del beb.  Las mamas seguirn creciendo y le dolern. A veces, puede haber una secrecin amarilla de las mamas llamada calostro.  El ombligo puede salir hacia afuera.  Puede sentir que le falta el aire debido a que se expande el tero.  Puede notar que el feto  "baja" o lo siente ms bajo, en el abdomen.  Puede tener una prdida de secrecin mucosa con sangre. Esto suele ocurrir en el trmino de unos pocos das a una semana antes de que comience el trabajo de parto.  El cuello del tero se vuelve delgado y blando (se borra) cerca de la fecha de parto. QU DEBE ESPERAR EN LOS EXMENES PRENATALES Le harn exmenes prenatales cada 2semanas hasta la semana36. A partir de ese momento le harn exmenes semanales. Durante una visita prenatal de rutina:  La pesarn para asegurarse de que usted y el feto estn creciendo normalmente.  Le tomarn la presin arterial.  Le medirn el abdomen para controlar el desarrollo del beb.  Se escucharn los latidos cardacos fetales.  Se evaluarn los resultados de los estudios solicitados en visitas anteriores.  Le revisarn el cuello del tero cuando est prxima la fecha de parto para controlar si este se ha borrado. Alrededor de la semana36, el mdico le revisar el cuello del tero. Al mismo tiempo, realizar un anlisis de las secreciones del tejido vaginal. Este examen es para determinar si hay un tipo de bacteria, estreptococo Grupo B. El mdico le explicar esto con ms detalle. El mdico puede preguntarle lo siguiente:  Cmo le gustara que fuera el parto.  Cmo se siente.  Si siente los movimientos del beb.  Si ha tenido sntomas anormales, como prdida de lquido, sangrado, dolores de cabeza intensos o clicos abdominales.  Si est consumiendo algn producto que contenga tabaco, como cigarrillos, tabaco de mascar y   cigarrillos electrnicos.  Si tiene alguna pregunta. Otros exmenes o estudios de deteccin que pueden realizarse durante el tercer trimestre incluyen lo siguiente:  Anlisis de sangre para controlar los niveles de hierro (anemia).  Controles fetales para determinar su salud, nivel de actividad y crecimiento. Si tiene alguna enfermedad o hay problemas durante el embarazo, le harn  estudios.  Prueba del VIH (virus de inmunodeficiencia humana). Si corre un riesgo alto, pueden realizarle una prueba de deteccin del VIH durante el tercer trimestre del embarazo. FALSO TRABAJO DE PARTO Es posible que sienta contracciones leves e irregulares que finalmente desaparecen. Se llaman contracciones de Braxton Hicks o falso trabajo de parto. Las contracciones pueden durar horas, das o incluso semanas, antes de que el verdadero trabajo de parto se inicie. Si las contracciones ocurren a intervalos regulares, se intensifican o se hacen dolorosas, lo mejor es que la revise el mdico. SIGNOS DE TRABAJO DE PARTO  Clicos de tipo menstrual.  Contracciones cada 5minutos o menos.  Contracciones que comienzan en la parte superior del tero y se extienden hacia abajo, a la zona inferior del abdomen y la espalda.  Sensacin de mayor presin en la pelvis o dolor de espalda.  Una secrecin de mucosidad acuosa o con sangre que sale de la vagina. Si tiene alguno de estos signos antes de la semana37 del embarazo, llame a su mdico de inmediato. Debe concurrir al hospital para que la controlen inmediatamente. INSTRUCCIONES PARA EL CUIDADO EN EL HOGAR  Evite fumar, consumir hierbas, beber alcohol y tomar frmacos que no le hayan recetado. Estas sustancias qumicas afectan la formacin y el desarrollo del beb.  No consuma ningn producto que contenga tabaco, lo que incluye cigarrillos, tabaco de mascar y cigarrillos electrnicos. Si necesita ayuda para dejar de fumar, consulte al mdico. Puede recibir asesoramiento y otro tipo de recursos para dejar de fumar.  Siga las indicaciones del mdico en relacin con el uso de medicamentos. Durante el embarazo, hay medicamentos que son seguros de tomar y otros que no.  Haga ejercicio solamente como se lo haya indicado el mdico. Sentir clicos uterinos es un buen signo para detener la actividad fsica.  Contine comiendo alimentos sanos con  regularidad.  Use un sostn que le brinde buen soporte si le duelen las mamas.  No se d baos de inmersin en agua caliente, baos turcos ni saunas.  Use el cinturn de seguridad en todo momento mientras conduce.  No coma carne cruda ni queso sin cocinar; evite el contacto con las bandejas sanitarias de los gatos y la tierra que estos animales usan. Estos elementos contienen grmenes que pueden causar defectos congnitos en el beb.  Tome las vitaminas prenatales.  Tome entre 1500 y 2000mg de calcio diariamente comenzando en la semana20 del embarazo hasta el parto.  Si est estreida, pruebe un laxante suave (si el mdico lo autoriza). Consuma ms alimentos ricos en fibra, como vegetales y frutas frescos y cereales integrales. Beba gran cantidad de lquido para mantener la orina de tono claro o color amarillo plido.  Dese baos de asiento con agua tibia para aliviar el dolor o las molestias causadas por las hemorroides. Use una crema para las hemorroides si el mdico la autoriza.  Si tiene venas varicosas, use medias de descanso. Eleve los pies durante 15minutos, 3 o 4veces por da. Limite el consumo de sal en su dieta.  Evite levantar objetos pesados, use zapatos de tacones bajos y mantenga una buena postura.  Descanse con las piernas elevadas si tiene   calambres o dolor de cintura.  Visite a su dentista si no lo ha hecho durante el embarazo. Use un cepillo de dientes blando para higienizarse los dientes y psese el hilo dental con suavidad.  Puede seguir manteniendo relaciones sexuales, a menos que el mdico le indique lo contrario.  No haga viajes largos excepto que sea absolutamente necesario y solo con la autorizacin del mdico.  Tome clases prenatales para entender, practicar y hacer preguntas sobre el trabajo de parto y el parto.  Haga un ensayo de la partida al hospital.  Prepare el bolso que llevar al hospital.  Prepare la habitacin del beb.  Concurra a todas  las visitas prenatales segn las indicaciones de su mdico.  SOLICITE ATENCIN MDICA SI:  No est segura de que est en trabajo de parto o de que ha roto la bolsa de las aguas.  Tiene mareos.  Siente clicos leves, presin en la pelvis o dolor persistente en el abdomen.  Tiene nuseas, vmitos o diarrea persistentes.  Observa una secrecin vaginal con mal olor.  Siente dolor al orinar.  SOLICITE ATENCIN MDICA DE INMEDIATO SI:  Tiene fiebre.  Tiene una prdida de lquido por la vagina.  Tiene sangrado o pequeas prdidas vaginales.  Siente dolor intenso o clicos en el abdomen.  Sube o baja de peso rpidamente.  Tiene dificultad para respirar y siente dolor de pecho.  Sbitamente se le hinchan mucho el rostro, las manos, los tobillos, los pies o las piernas.  No ha sentido los movimientos del beb durante una hora.  Siente un dolor de cabeza intenso que no se alivia con medicamentos.  Su visin se modifica.  Esta informacin no tiene como fin reemplazar el consejo del mdico. Asegrese de hacerle al mdico cualquier pregunta que tenga. Document Released: 03/26/2005 Document Revised: 07/07/2014 Document Reviewed: 08/17/2012 Elsevier Interactive Patient Education  2017 Elsevier Inc.  

## 2016-12-17 ENCOUNTER — Encounter: Payer: Self-pay | Admitting: Obstetrics and Gynecology

## 2016-12-19 ENCOUNTER — Inpatient Hospital Stay (HOSPITAL_COMMUNITY): Payer: Medicaid Other | Admitting: Anesthesiology

## 2016-12-19 ENCOUNTER — Inpatient Hospital Stay (HOSPITAL_COMMUNITY)
Admission: RE | Admit: 2016-12-19 | Discharge: 2016-12-21 | DRG: 775 | Disposition: A | Discharge status: Home / Self Care | Payer: Medicaid Other | Source: Ambulatory Visit | Attending: Family Medicine | Admitting: Family Medicine

## 2016-12-19 ENCOUNTER — Encounter (HOSPITAL_COMMUNITY): Payer: Self-pay

## 2016-12-19 VITALS — BP 96/64 | HR 65 | Temp 97.7°F | Resp 16 | Ht 62.0 in | Wt 173.0 lb

## 2016-12-19 DIAGNOSIS — O26613 Liver and biliary tract disorders in pregnancy, third trimester: Secondary | ICD-10-CM

## 2016-12-19 DIAGNOSIS — O26643 Intrahepatic cholestasis of pregnancy, third trimester: Secondary | ICD-10-CM

## 2016-12-19 DIAGNOSIS — Z8759 Personal history of other complications of pregnancy, childbirth and the puerperium: Secondary | ICD-10-CM

## 2016-12-19 DIAGNOSIS — O99214 Obesity complicating childbirth: Secondary | ICD-10-CM | POA: Diagnosis present

## 2016-12-19 DIAGNOSIS — O2662 Liver and biliary tract disorders in childbirth: Principal | ICD-10-CM | POA: Diagnosis present

## 2016-12-19 DIAGNOSIS — K831 Obstruction of bile duct: Secondary | ICD-10-CM | POA: Diagnosis present

## 2016-12-19 DIAGNOSIS — O9962 Diseases of the digestive system complicating childbirth: Secondary | ICD-10-CM | POA: Diagnosis present

## 2016-12-19 DIAGNOSIS — E669 Obesity, unspecified: Secondary | ICD-10-CM | POA: Diagnosis present

## 2016-12-19 DIAGNOSIS — Z8719 Personal history of other diseases of the digestive system: Secondary | ICD-10-CM

## 2016-12-19 DIAGNOSIS — Z6831 Body mass index (BMI) 31.0-31.9, adult: Secondary | ICD-10-CM

## 2016-12-19 DIAGNOSIS — O09293 Supervision of pregnancy with other poor reproductive or obstetric history, third trimester: Secondary | ICD-10-CM

## 2016-12-19 DIAGNOSIS — K219 Gastro-esophageal reflux disease without esophagitis: Secondary | ICD-10-CM | POA: Diagnosis present

## 2016-12-19 DIAGNOSIS — Z3A37 37 weeks gestation of pregnancy: Secondary | ICD-10-CM | POA: Diagnosis not present

## 2016-12-19 DIAGNOSIS — O09299 Supervision of pregnancy with other poor reproductive or obstetric history, unspecified trimester: Secondary | ICD-10-CM

## 2016-12-19 HISTORY — DX: Personal history of other diseases of the digestive system: Z87.19

## 2016-12-19 HISTORY — DX: Personal history of other complications of pregnancy, childbirth and the puerperium: Z87.59

## 2016-12-19 LAB — COMPREHENSIVE METABOLIC PANEL
ALT: 16 U/L (ref 14–54)
ANION GAP: 9 (ref 5–15)
AST: 23 U/L (ref 15–41)
Albumin: 2.8 g/dL — ABNORMAL LOW (ref 3.5–5.0)
Alkaline Phosphatase: 161 U/L — ABNORMAL HIGH (ref 38–126)
BILIRUBIN TOTAL: 0.3 mg/dL (ref 0.3–1.2)
BUN: 7 mg/dL (ref 6–20)
CHLORIDE: 105 mmol/L (ref 101–111)
CO2: 19 mmol/L — ABNORMAL LOW (ref 22–32)
Calcium: 8.7 mg/dL — ABNORMAL LOW (ref 8.9–10.3)
Creatinine, Ser: 0.56 mg/dL (ref 0.44–1.00)
GFR calc Af Amer: >60 mL/min (ref >60)
Glucose, Bld: 117 mg/dL — ABNORMAL HIGH (ref 65–99)
POTASSIUM: 3.6 mmol/L (ref 3.5–5.1)
Sodium: 133 mmol/L — ABNORMAL LOW (ref 135–145)
TOTAL PROTEIN: 6.3 g/dL — AB (ref 6.5–8.1)

## 2016-12-19 LAB — TYPE AND SCREEN
ABO/RH(D): O POS
ANTIBODY SCREEN: NEGATIVE

## 2016-12-19 LAB — CBC
HCT: 33.8 % — ABNORMAL LOW (ref 36.0–46.0)
Hemoglobin: 11.4 g/dL — ABNORMAL LOW (ref 12.0–15.0)
MCH: 27 pg (ref 26.0–34.0)
MCHC: 33.7 g/dL (ref 30.0–36.0)
MCV: 80.1 fL (ref 78.0–100.0)
PLATELETS: 305 10*3/uL (ref 150–400)
RBC: 4.22 MIL/uL (ref 3.87–5.11)
RDW: 14.7 % (ref 11.5–15.5)
WBC: 8.7 10*3/uL (ref 4.0–10.5)

## 2016-12-19 LAB — RPR: RPR Ser Ql: NONREACTIVE

## 2016-12-19 MED ORDER — DIPHENHYDRAMINE HCL 50 MG/ML IJ SOLN: 12.5000 mg | INTRAMUSCULAR | Status: DC | PRN

## 2016-12-19 MED ORDER — FENTANYL 2.5 MCG/ML BUPIVACAINE 1/10 % EPIDURAL INFUSION (WH - ANES)
14.0000 mL/h | INTRAMUSCULAR | Status: DC | PRN
  Administered 2016-12-19: 14 mL/h via EPIDURAL

## 2016-12-19 MED ORDER — OXYTOCIN BOLUS FROM INFUSION: 500.0000 mL | Freq: Once | INTRAVENOUS | Status: DC

## 2016-12-19 MED ORDER — EPHEDRINE 5 MG/ML INJ
10.0000 mg | INTRAVENOUS | Status: DC | PRN
  Filled 2016-12-19: qty 2
  Filled 2016-12-19: qty 4

## 2016-12-19 MED ORDER — SOD CITRATE-CITRIC ACID 500-334 MG/5ML PO SOLN: 30.0000 mL | ORAL | Status: DC | PRN

## 2016-12-19 MED ORDER — EPHEDRINE 5 MG/ML INJ
10.0000 mg | INTRAVENOUS | Status: DC | PRN
  Administered 2016-12-19: 10 mg via INTRAVENOUS
  Filled 2016-12-19: qty 4
  Filled 2016-12-19: qty 2

## 2016-12-19 MED ORDER — LACTATED RINGERS IV SOLN
500.0000 mL | Freq: Once | INTRAVENOUS | Status: AC
  Administered 2016-12-19: 500 mL via INTRAVENOUS

## 2016-12-19 MED ORDER — LACTATED RINGERS IV SOLN
500.0000 mL | Freq: Once | INTRAVENOUS | Status: AC
  Administered 2016-12-20: 500 mL via INTRAVENOUS

## 2016-12-19 MED ORDER — PHENYLEPHRINE 40 MCG/ML (10ML) SYRINGE FOR IV PUSH (FOR BLOOD PRESSURE SUPPORT)
80.0000 ug | PREFILLED_SYRINGE | INTRAVENOUS | Status: DC | PRN
  Administered 2016-12-19: via INTRAVENOUS
  Filled 2016-12-19: qty 5

## 2016-12-19 MED ORDER — ONDANSETRON HCL 4 MG/2ML IJ SOLN: 4.0000 mg | Freq: Four times a day (QID) | INTRAMUSCULAR | Status: DC | PRN

## 2016-12-19 MED ORDER — LACTATED RINGERS IV SOLN
INTRAVENOUS | Status: DC
  Administered 2016-12-19 (×2): via INTRAVENOUS

## 2016-12-19 MED ORDER — LACTATED RINGERS IV SOLN
500.0000 mL | INTRAVENOUS | Status: DC | PRN
  Administered 2016-12-19 (×2): 500 mL via INTRAVENOUS

## 2016-12-19 MED ORDER — FENTANYL 2.5 MCG/ML BUPIVACAINE 1/10 % EPIDURAL INFUSION (WH - ANES)
INTRAMUSCULAR | Status: AC
  Filled 2016-12-19: qty 100

## 2016-12-19 MED ORDER — MISOPROSTOL 25 MCG QUARTER TABLET
25.0000 ug | ORAL_TABLET | ORAL | Status: DC | PRN
  Administered 2016-12-19: 25 ug via VAGINAL
  Filled 2016-12-19 (×2): qty 1

## 2016-12-19 MED ORDER — FENTANYL CITRATE (PF) 100 MCG/2ML IJ SOLN
100.0000 ug | INTRAMUSCULAR | Status: DC | PRN
  Administered 2016-12-19: 100 ug via INTRAVENOUS
  Filled 2016-12-19: qty 2

## 2016-12-19 MED ORDER — FENTANYL CITRATE (PF) 100 MCG/2ML IJ SOLN: 50.0000 ug | INTRAMUSCULAR | Status: DC | PRN

## 2016-12-19 MED ORDER — OXYTOCIN 40 UNITS IN LACTATED RINGERS INFUSION - SIMPLE MED
2.5000 [IU]/h | INTRAVENOUS | Status: DC
  Filled 2016-12-19: qty 1000

## 2016-12-19 MED ORDER — PHENYLEPHRINE 40 MCG/ML (10ML) SYRINGE FOR IV PUSH (FOR BLOOD PRESSURE SUPPORT)
80.0000 ug | PREFILLED_SYRINGE | INTRAVENOUS | Status: DC | PRN
  Filled 2016-12-19: qty 10
  Filled 2016-12-19: qty 5

## 2016-12-19 MED ORDER — LIDOCAINE HCL (PF) 1 % IJ SOLN
INTRAMUSCULAR | Status: DC | PRN
  Administered 2016-12-19: 3 mL via EPIDURAL
  Administered 2016-12-19: 2 mL via EPIDURAL
  Administered 2016-12-19: 5 mL via EPIDURAL

## 2016-12-19 MED ORDER — TERBUTALINE SULFATE 1 MG/ML IJ SOLN
0.2500 mg | Freq: Once | INTRAMUSCULAR | Status: AC | PRN
  Administered 2016-12-19: 0.25 mg via SUBCUTANEOUS
  Filled 2016-12-19: qty 1

## 2016-12-19 MED ORDER — PHENYLEPHRINE 40 MCG/ML (10ML) SYRINGE FOR IV PUSH (FOR BLOOD PRESSURE SUPPORT)
PREFILLED_SYRINGE | INTRAVENOUS | Status: AC
  Filled 2016-12-19: qty 10

## 2016-12-19 MED ORDER — LIDOCAINE HCL (PF) 1 % IJ SOLN
30.0000 mL | INTRAMUSCULAR | Status: DC | PRN
  Filled 2016-12-19: qty 30

## 2016-12-19 MED ORDER — ACETAMINOPHEN 325 MG PO TABS: 650.0000 mg | ORAL_TABLET | ORAL | Status: DC | PRN

## 2016-12-19 NOTE — Anesthesia Procedure Notes (Signed)
Epidural Patient location during procedure: OB Start time: 12/19/2016 11:08 PM End time: 12/19/2016 11:14 PM  Staffing Anesthesiologist: Cecile HearingURK, Elizabella Nolet EDWARD Performed: anesthesiologist   Preanesthetic Checklist Completed: patient identified, pre-op evaluation, timeout performed, IV checked, risks and benefits discussed and monitors and equipment checked  Epidural Patient position: sitting Prep: DuraPrep Patient monitoring: blood pressure and continuous pulse ox Approach: midline Location: L3-L4 Injection technique: LOR air  Needle:  Needle type: Tuohy  Needle gauge: 17 G Needle length: 9 cm Needle insertion depth: 6 cm Catheter size: 19 Gauge Catheter at skin depth: 11 cm Test dose: negative and Other (1% Lidocaine)  Additional Notes Patient identified.  Risk benefits discussed including failed block, incomplete pain control, headache, nerve damage, paralysis, blood pressure changes, nausea, vomiting, reactions to medication both toxic or allergic, and postpartum back pain.  Patient expressed understanding and wished to proceed.  All questions were answered.  Sterile technique used throughout procedure and epidural site dressed with sterile barrier dressing. No paresthesia or other complications noted. The patient did not experience any signs of intravascular injection such as tinnitus or metallic taste in mouth nor signs of intrathecal spread such as rapid motor block. Please see nursing notes for vital signs. Reason for block:procedure for pain

## 2016-12-19 NOTE — Progress Notes (Signed)
Patient is a [id number] admitted for IOL for cholestasis.   Dilation: 6.5 Effacement (%): 70 Cervical Position: Middle Station: -1 Presentation: Vertex Exam by:: Veronica Mensah   FHT: 130bpm, mod var, +accels, no decels TOCO: q2-3 min   A/P: Labor: progressing normally. S/p cyto/foley/AROM, currently contracting well without pitocin Pain: IV meds FWB: Cat 1   Continue expectant management Anticipate SVD  Howard PouchLauren Hennessy Bartel, MD PGY-1 [facility]

## 2016-12-19 NOTE — Progress Notes (Signed)
Subjective: Patient reporting more contractions, but still comfortable. Foley bulb recently came out.  Objective: Vitals:   12/19/16 0740  Weight: 173 lb (78.5 kg)  Height: 5\' 2"  (1.575 m)   FHT:  FHR: 130 bpm, variability: moderate,  accelerations:  Present,  decelerations:  Absent UC:   regular, every 1-3 minutes SVE:   Dilation: 3 Effacement (%): 50 Station: -2 Exam by:: neil SNM  Labs: Lab Results  Component Value Date   WBC 8.7 12/19/2016   HGB 11.4 (L) 12/19/2016   HCT 33.8 (L) 12/19/2016   MCV 80.1 12/19/2016   PLT 305 12/19/2016    Assessment / Plan: IUP at term. IOL for cholestasis. GBS neg.   Discussed with patient risks and benefits of AROM. Patient agreeable with plan.   Plan: AROM with small amount of clear fluid. Manage expectantly, start pitocin if no progress. Anticipate NSVD.  Cleone SlimCaroline Neill SNM 12/19/2016, 4:02 PM  I have seen and examined this patient and I agree with the above. Cam HaiSHAW, KIMBERLY CNM 4:45 PM 12/19/2016

## 2016-12-19 NOTE — Progress Notes (Signed)
Byrd HesselbachMaria Herminia Ronnell GuadalajaraDominguez Torres is a 36 y.o. [id number] at 5563w1d  admitted for induction of labor due to cholestasis.  Subjective: Patient comfortable. Feeling irregular contractions.   Objective: Vitals:   12/19/16 0740  Weight: 173 lb (78.5 kg)  Height: 5\' 2"  (1.575 m)   FHT:  FHR: 130 bpm, variability: moderate,  accelerations:  Present,  decelerations:  Absent UC:   regular, every 2-4 minutes SVE:   Dilation: 1.5 Effacement (%): 50 Station: -2 Exam by:: m wilkins rnc   Labs: Lab Results  Component Value Date   WBC 8.7 12/19/2016   HGB 11.4 (L) 12/19/2016   HCT 33.8 (L) 12/19/2016   MCV 80.1 12/19/2016   PLT 305 12/19/2016    Assessment / Plan: IUP at term. IOL for cholestasis. GBS neg.   Discussed with patient risks and benefits of foley bulb placement for labor induction. Patient agreeable to placement. Foley bulb placed and inflated with 60cc without difficulty. Patient tolerated procedure well.  Plan: Foley bulb placement. Anticipate AROM and pitocin when foley falls out. Anticipate NSVD.   Cleone SlimCaroline Alliana Mcauliff SNM 12/19/2016, 1:49 PM

## 2016-12-19 NOTE — Anesthesia Preprocedure Evaluation (Addendum)
Anesthesia Evaluation  Patient identified by MRN, date of birth, ID band Patient awake    Reviewed: Allergy & Precautions, NPO status , Patient's Chart, lab work & pertinent test results  Airway Mallampati: II  TM Distance: >3 FB Neck ROM: Full    Dental  (+) Teeth Intact, Dental Advisory Given   Pulmonary neg pulmonary ROS,    Pulmonary exam normal breath sounds clear to auscultation       Cardiovascular negative cardio ROS Normal cardiovascular exam Rhythm:Regular Rate:Normal     Neuro/Psych negative neurological ROS     GI/Hepatic Neg liver ROS, GERD  ,  Endo/Other  Obesity   Renal/GU negative Renal ROS     Musculoskeletal negative musculoskeletal ROS (+)   Abdominal   Peds  Hematology  (+) Blood dyscrasia, anemia , Plt 305k   Anesthesia Other Findings Day of surgery medications reviewed with the patient.  Reproductive/Obstetrics (+) Pregnancy                             Anesthesia Physical Anesthesia Plan  ASA: II  Anesthesia Plan: Epidural   Post-op Pain Management:    Induction:   PONV Risk Score and Plan:   Airway Management Planned:   Additional Equipment:   Intra-op Plan:   Post-operative Plan:   Informed Consent: I have reviewed the patients History and Physical, chart, labs and discussed the procedure including the risks, benefits and alternatives for the proposed anesthesia with the patient or authorized representative who has indicated his/her understanding and acceptance.   Dental advisory given  Plan Discussed with:   Anesthesia Plan Comments: (Patient identified. Risks/Benefits/Options discussed with patient including but not limited to bleeding, infection, nerve damage, paralysis, failed block, incomplete pain control, headache, blood pressure changes, nausea, vomiting, reactions to medication both or allergic, itching and postpartum back pain. Confirmed  with bedside nurse the patient's most recent platelet count. Confirmed with patient that they are not currently taking any anticoagulation, have any bleeding history or any family history of bleeding disorders. Patient expressed understanding and wished to proceed. All questions were answered.   **Spanish interpreter utilized throughout entire patient encounter.**)       Anesthesia Quick Evaluation

## 2016-12-19 NOTE — Progress Notes (Signed)
Pt has a history of a term IUFD.  I initially met her to see how that was affecting her today.  In my time with her, she did not mention this at all or show any sign that it was on her mind.  Her history shows that she has had 3 children since her loss.  We spoke about some other hardships including that they have only been here for 2 years and the remainder of their family is in [place] besides one sister.  We spoke about the difficulty of raising children and the hardships she and her family have experienced.  She was very appreciative of the conversation and is very happy that her baby will soon be here.  [place], [facility] Pager, [phone] 2:14 PM    12/19/16 1400  Clinical Encounter Type  Visited With Patient  Visit Type Spiritual support;Social support  Referral From Nurse  Spiritual Encounters  Spiritual Needs Emotional

## 2016-12-19 NOTE — Anesthesia Pain Management Evaluation Note (Signed)
  [facility] Pain Management Visit Note  Patient: Rebecca Ibarra, 36 y.o., female  "Hello [facility] am a [facility]. We have an anesthesia team available at all times to provide care throughout the hospital, including epidural management and anesthesia for C-section. I don't know your plan for the delivery whether it a natural birth, water birth, IV sedation, nitrous supplementation, doula or epidural, but we want to meet your pain goals."   1.Was your pain managed to your expectations on prior hospitalizations?   Yes   2.What is your expectation for pain management during this hospitalization?     Labor support without medications  3.How can we help you reach that goal? Be available;interpreter used  Record the patient's initial score and the patient's pain goal.   Pain: 0  Pain Goal: 10 [facility] wants you to be able to say your pain was always managed very well.  Edison PaceWILKERSON,Michaeleen Down 12/19/2016

## 2016-12-19 NOTE — H&P (Signed)
Rebecca Ibarra is a 36 y.o. female [id number] @ 37.1wks presenting for IOL due to cholestasis. Denies ctx, leaking or bldg. Her preg has been followed by the [facility] service and has been remarkable for 1) hx +PPD/neg CXR in Feb 2018; Rifampin recommended (due to hx incarceration and [place]) but will be delayed until after delivery due to ICP 2) AMA- nl quad 3) hx term IUFD  OB History    Gravida Para Term Preterm AB Living   6 4 4  0 1 3   SAB TAB Ectopic Multiple Live Births   1 0 0 0 3     Past Medical History:  Diagnosis Date  . GERD (gastroesophageal reflux disease) 11/26/2016  . Medical history non-contributory    Past Surgical History:  Procedure Laterality Date  . APPENDECTOMY    . CHOLECYSTECTOMY  2015   Family History: family history includes Cancer in her mother; Heart disease in her sister. Social History:  reports that she has never smoked. She has never used smokeless tobacco. She reports that she does not drink alcohol or use drugs.     Maternal Diabetes: No Genetic Screening: Normal Maternal Ultrasounds/Referrals: Normal Fetal Ultrasounds or other Referrals:  None Maternal Substance Abuse:  No Significant Maternal Medications:  None Significant Maternal Lab Results:  Lab values include: Group B Strep negative Other Comments:  None  ROS History Dilation: 1.5 Effacement (%): 50 Station: -2 Exam by:: m wilkins rnc Height 5\' 2"  (1.575 m), weight 78.5 kg (173 lb), last menstrual period 04/03/2016, unknown if currently breastfeeding. Exam Physical Exam  Constitutional: She is oriented to person, place, and time. She appears well-developed.  HENT:  Head: Normocephalic.  Neck: Normal range of motion.  Cardiovascular: Normal rate.   Respiratory: Effort normal.  GI:  EFM 130s, +accels, no decels Ctx q 2-5 mins  Musculoskeletal: Normal range of motion.  Neurological: She is alert and oriented to person, place, and time.  Skin: Skin is warm  and dry.  Psychiatric: She has a normal mood and affect. Her behavior is normal. Thought content normal.    Prenatal labs: ABO, Rh: --/--/O POS (06/22 0730) Antibody: NEG (06/22 0730) Rubella: Immune (01/24 0000) RPR: Non Reactive (04/23 1439)  HBsAg: Negative (01/24 0000)  HIV: Non Reactive (04/23 1439)  GBS:   neg  Assessment/Plan: IUP@37 .1wks Cholestasis Unfavorable cx  [facility]pen cx w/ cytotec, followed by foley bulb/Pit prn Anticipate SVD   SHAW, KIMBERLY CNM 12/19/2016, 11:54 AM

## 2016-12-20 ENCOUNTER — Encounter (HOSPITAL_COMMUNITY): Payer: Self-pay

## 2016-12-20 DIAGNOSIS — K831 Obstruction of bile duct: Secondary | ICD-10-CM

## 2016-12-20 DIAGNOSIS — O2662 Liver and biliary tract disorders in childbirth: Secondary | ICD-10-CM

## 2016-12-20 DIAGNOSIS — Z3A37 37 weeks gestation of pregnancy: Secondary | ICD-10-CM

## 2016-12-20 MED ORDER — DIPHENHYDRAMINE HCL 25 MG PO CAPS: 25.0000 mg | ORAL_CAPSULE | Freq: Four times a day (QID) | ORAL | Status: DC | PRN

## 2016-12-20 MED ORDER — COCONUT OIL OIL: 1.0000 "application " | TOPICAL_OIL | Status: DC | PRN

## 2016-12-20 MED ORDER — TETANUS-DIPHTH-ACELL PERTUSSIS 5-2.5-18.5 LF-MCG/0.5 IM SUSP: 0.5000 mL | Freq: Once | INTRAMUSCULAR | Status: DC

## 2016-12-20 MED ORDER — ACETAMINOPHEN 325 MG PO TABS
650.0000 mg | ORAL_TABLET | ORAL | Status: DC | PRN
  Administered 2016-12-20 – 2016-12-21 (×4): 650 mg via ORAL
  Filled 2016-12-20 (×4): qty 2

## 2016-12-20 MED ORDER — ONDANSETRON HCL 4 MG PO TABS: 4.0000 mg | ORAL_TABLET | ORAL | Status: DC | PRN

## 2016-12-20 MED ORDER — PRENATAL MULTIVITAMIN CH
1.0000 | ORAL_TABLET | Freq: Every day | ORAL | Status: DC
  Administered 2016-12-20 – 2016-12-21 (×2): 1 via ORAL
  Filled 2016-12-20 (×2): qty 1

## 2016-12-20 MED ORDER — BUPIVACAINE HCL (PF) 0.25 % IJ SOLN
INTRAMUSCULAR | Status: DC | PRN
Start: 2016-12-20 — End: 2016-12-20
  Administered 2016-12-20: 5 mL via EPIDURAL

## 2016-12-20 MED ORDER — BENZOCAINE-MENTHOL 20-0.5 % EX AERO: 1.0000 "application " | INHALATION_SPRAY | CUTANEOUS | Status: DC | PRN

## 2016-12-20 MED ORDER — DIBUCAINE 1 % RE OINT: 1.0000 "application " | TOPICAL_OINTMENT | RECTAL | Status: DC | PRN

## 2016-12-20 MED ORDER — IBUPROFEN 600 MG PO TABS
600.0000 mg | ORAL_TABLET | Freq: Four times a day (QID) | ORAL | Status: DC
  Administered 2016-12-20 – 2016-12-21 (×6): 600 mg via ORAL
  Filled 2016-12-20 (×6): qty 1

## 2016-12-20 MED ORDER — SIMETHICONE 80 MG PO CHEW: 80.0000 mg | CHEWABLE_TABLET | ORAL | Status: DC | PRN

## 2016-12-20 MED ORDER — ZOLPIDEM TARTRATE 5 MG PO TABS
5.0000 mg | ORAL_TABLET | Freq: Every evening | ORAL | Status: DC | PRN
Start: 2016-12-20 — End: 2016-12-21

## 2016-12-20 MED ORDER — WITCH HAZEL-GLYCERIN EX PADS: 1.0000 "application " | MEDICATED_PAD | CUTANEOUS | Status: DC | PRN

## 2016-12-20 MED ORDER — ONDANSETRON HCL 4 MG/2ML IJ SOLN: 4.0000 mg | INTRAMUSCULAR | Status: DC | PRN

## 2016-12-20 MED ORDER — SENNOSIDES-DOCUSATE SODIUM 8.6-50 MG PO TABS: 2.0000 | ORAL_TABLET | ORAL | Status: DC

## 2016-12-20 MED ORDER — LACTATED RINGERS IV SOLN: INTRAVENOUS | Status: DC

## 2016-12-20 NOTE — Lactation Note (Addendum)
This note was copied from a baby's chart. Lactation Consultation Note  Patient Name: Rebecca Ibarra ZOXWR'UToday's Date: 12/20/2016 Reason for consult: Follow-up assessment;Difficult latch   Follow up with mom of 19 hour old infant. Spoke with mom with assistance of Hospital Interpreter, Raquel.  Infant with 3 BF for 10-20 minutes, 1 BF attempt, EBM x 1 of 5 cc, fo x 2 of 10-15 cc via bottle, 2 voids and 1 stool since birth. LATCH Scores 3-5. Infant weight 6 lb 3.5 oz.    Mom reports infant latching better with the NS. She reports she is not pumping. Enc mom to pump 4-6 x a day due to NS use and early term infant and to feed all EBM back to infant before offering formula. Mom voiced understanding. Parents with out questions/concerns. Mom declined need for [facility] at this time.    Maternal Data Formula Feeding for Exclusion: No Has patient been taught Hand Expression?: Yes  Feeding Feeding Type: Bottle Fed - Formula Nipple Type: Slow - flow Length of feed: 10 min  LATCH Score/Interventions                      Lactation Tools Discussed/Used Tools: Nipple Shields Nipple shield size: 24 Pump Review: Setup, frequency, and cleaning Initiated by:: Reviewed and encouraged at least 4-6 x a day   Consult Status Consult Status: Follow-up Date: 12/21/16 Follow-up type: In-patient    Rebecca Ibarra 12/20/2016, 10:04 PM

## 2016-12-20 NOTE — Lactation Note (Signed)
This note was copied from a baby's chart. Lactation Consultation Note Spanish speaking mom needs interpreter. Dexter used. 1st interpreter Rebecca Ibarra [phone], got disconnected during teaching. 2nd interpreter Rebecca Ibarra [phone] finished teaching in consult. Baby fussy, crying rooting, trying to suck on moms inverted nipple. The nipple wasn't coning out.holding nipple trying to latch, explained can't get nipple in w/fingers on them.  Mom kept trying to latch baby. LC brought 2 NS and laid on bed while I was getting hand pump put together. Mom grabbed #24 NS, said I need the large one. Fitted well. VERY CHALLENGING trying to get to latch on #24 NS. It was to large. Mom kept pushing baby's head into breast, stretching breast tissue to baby or pulling backwards. Mom kept holding NS on. Explained didn't need to do that if compressed like LC showed her. LC placed moms hands and she would only keep them there short time. Interpreter saying what LC asked. Encouraged mom to relax and let LC show her how to use NS and feed baby.  Challenge is moms breast tissue soft, baby sinks into breast, 1. because she pushes to hard w/hand holding baby, 2. Hand placement and breast support. Fitted #20 NS, tight on nipple but baby can latch better. Fits baby's little mouth better. Only slight colostrum noticed at base, not anything to give in volume.  Mom shown how to use DEBP & how to disassemble, clean, & reassemble parts. Mom knows to pump q3h for 15-20 min. Mom encouraged to feed baby 8-12 times/24 hours and with feeding cues.  [facility]/LC brochure given w/resources, support groups and LC services. Mom is breast/formula. Supplemented w/Similac slow flow nipple. Took well.  Educated on newborn behavior and feeding habits, STS, I&O, supply and demands.   Patient Name: Rebecca Aida PufferMaria Herminia Dominguez Ibarra Today's Date: 12/20/2016 Reason for consult: Initial assessment   Maternal Data Has patient been taught Hand Expression?: Yes Does  the patient have breastfeeding experience prior to this delivery?: Yes  Feeding Feeding Type: Formula Nipple Type: Slow - flow Length of feed: 20 min  LATCH Score/Interventions Latch: Repeated attempts needed to sustain latch, nipple held in mouth throughout feeding, stimulation needed to elicit sucking reflex. Intervention(s): Adjust position;Assist with latch;Breast massage;Breast compression  Audible Swallowing: None Intervention(s): Hand expression;Skin to skin Intervention(s): Alternate breast massage  Type of Nipple: Inverted Intervention(s): Shells;Hand pump;Double electric pump  Comfort (Breast/Nipple): Soft / non-tender     Hold (Positioning): Full assist, staff holds infant at breast Intervention(s): Breastfeeding basics reviewed;Support Pillows;Position options;Skin to skin  LATCH Score: 3  Lactation Tools Discussed/Used Tools: Shells;Nipple Dorris CarnesShields;Pump Nipple shield size: 20 Shell Type: Inverted Breast pump type: Double-Electric Breast Pump WIC Program: Yes Pump Review: Setup, frequency, and cleaning;Milk Storage Initiated by:: Peri JeffersonL. Janazia Schreier RN IBCLC Date initiated:: 12/20/16   Consult Status Consult Status: Follow-up Date: 12/20/16 Follow-up type: In-patient    Charyl DancerCARVER, Bodhi Stenglein G 12/20/2016, 6:23 AM

## 2016-12-20 NOTE — Progress Notes (Signed)
S: Patient seen & examined for progress of labor. Epidural in place. Patient feeling pressure with contractions.  Pushed for 20-30 minutes however baby +1 and not coming down.  Dilation: 10 Dilation Complete Date: 12/20/16 Dilation Complete Time: 0103 Effacement (%): 100 Cervical Position: Anterior Station: +1 Presentation: Vertex Exam by:: Dr Mosetta PuttFeng   FHT: 150 bpm, mod var, +accels, no decels TOCO: q523min   A/P: Labor: progressing normally - patient on side with peanut, labor down Pain: epidural FWB: Category I   Continue expectant management Anticipate SVD  Howard PouchLauren Ronak Duquette, MD PGY-1 [facility]

## 2016-12-21 MED ORDER — IBUPROFEN 600 MG PO TABS: 600.0000 mg | ORAL_TABLET | Freq: Four times a day (QID) | ORAL | 0 refills | Status: DC

## 2016-12-21 MED ORDER — ACETAMINOPHEN 325 MG PO TABS: 650.0000 mg | ORAL_TABLET | ORAL | 0 refills | Status: DC | PRN

## 2016-12-21 NOTE — Anesthesia Postprocedure Evaluation (Signed)
Anesthesia Post Note  Patient: Rebecca Ibarra  Procedure(s) Performed: * No procedures listed *     Patient location during evaluation: Mother Baby Anesthesia Type: Epidural Level of consciousness: awake Pain management: pain level controlled Vital Signs Assessment: post-procedure vital signs reviewed and stable Respiratory status: spontaneous breathing Cardiovascular status: stable Postop Assessment: no headache, no backache, epidural receding, patient able to bend at knees, no signs of nausea or vomiting and adequate PO intake Anesthetic complications: no    Last Vitals:  Vitals:   12/21/16 0100 12/21/16 0620  BP: (!) 98/59 96/64  Pulse: 76 65  Resp: 17 16  Temp: 36.7 C 36.5 C    Last Pain:  Vitals:   12/21/16 0620  TempSrc: Oral  PainSc:    Pain Goal: Patients Stated Pain Goal: 3 (12/20/16 0825)               Fanny DanceMULLINS,Maryjayne Kleven

## 2016-12-21 NOTE — Discharge Instructions (Signed)
Parto vaginal, cuidados posteriores  (Vaginal Delivery, Care After)  Siga estas instrucciones durante las próximas semanas. Estas indicaciones le proporcionan información acerca de cómo deberá cuidarse después del parto. El médico también podrá darle instrucciones más específicas. El tratamiento ha sido planificado según las prácticas médicas actuales, pero en algunos casos pueden ocurrir problemas. Llame al médico si tiene problemas o preguntas.  QUÉ ESPERAR DESPUÉS DEL PARTO  Después de un parto vaginal, es frecuente tener lo siguiente:  · Hemorragia leve de la vagina.  · Dolor en el abdomen, la vagina y la zona de la piel entre la abertura vaginal y el ano (perineo).  · Calambres pélvicos.  · Fatiga.  INSTRUCCIONES PARA EL CUIDADO EN EL HOGAR  Medicamentos  · Tome los medicamentos de venta libre y los recetados solamente como se lo haya indicado el médico.  · Si le recetaron un antibiótico, tómelo como se lo haya indicado el médico. No interrumpa la administración del antibiótico hasta que lo haya terminado.  Conducir  · No conduzca ni opere maquinaria pesada mientras toma analgésicos recetados.  · No conduzca durante 24 horas si le administraron un sedante.  Estilo de vida  · No beba alcohol. Esto es de suma importancia si está amamantando o toma analgésicos.  · No consuma productos que contengan tabaco, incluidos cigarrillos, tabaco de mascar o cigarrillos electrónicos. Si necesita ayuda para dejar de fumar, consulte al médico.  Comida y bebida  · Beba al menos 8 vasos de 8 onzas (240 cc) de agua todos los días a menos que el médico le indique lo contrario. Si elige amamantar al bebé, quizá deba beber aún más cantidad de agua.  · Ingiera alimentos ricos en fibras todos los días. Estos alimentos pueden ayudarla a prevenir o aliviar el estreñimiento. Los alimentos ricos en fibras incluyen, entre otros:  ? Panes y cereales integrales.  ? Arroz integral.  ? Frijoles.  ? Frutas y verduras  frescas.  Actividad  · Reanude sus actividades normales como se lo haya indicado el médico. Pregúntele al médico qué actividades son seguras para usted.  · Descanse todo lo que pueda. Trate de descansar o tomar una siesta mientras el bebé está durmiendo.  · No levante objetos que pesen más de 10 libras (4,5 kg) hasta que el médico le diga que es seguro hacerlo.  · Hable con el médico sobre cuándo puede volver a tener relaciones sexuales. Esto puede depender de lo siguiente:  ? Riesgo de sufrir infecciones.  ? Velocidad de cicatrización.  ? Comodidad y deseo de tener relaciones sexuales.  Cuidados vaginales  · Si le realizaron una episiotomía o tuvo un desgarro vaginal, contrólese la zona todos los días para detectar signos de infección. Esté atenta a los siguientes signos:  ? Aumento del enrojecimiento, la hinchazón o el dolor.  ? Más líquido o sangre.  ? Calor.  ? Pus o mal olor.  · No use tampones ni se haga duchas vaginales hasta que el médico la autorice.  · Controle la sangre que elimina por la vagina para detectar coágulos. Pueden tener el aspecto de grumos de color rojo oscuro, marrón o negro.  Instrucciones generales  · Mantenga el perineo limpio y seco, como se lo haya indicado el médico.  · Use ropa cómoda y suelta.  · Cuando vaya al baño, siempre higienícese de adelante hacia atrás.  · Pregúntele al médico si puede ducharse o tomar baños de inmersión. Si se le realizó una episiotomía o tuvo un desgarro perineal durante el trabajo del parto o   el parto, es posible que el médico le indique que no tome baños de inmersión durante un determinado tiempo.  · Use un sostén que sujete y ajuste bien sus pechos.  · Si es posible, pídale a alguien que la ayude con las tareas del hogar y a cuidar del bebé durante al menos algunos días después de salir del hospital.  · Concurra a todas las visitas de seguimiento para usted y el bebé, como se lo haya indicado el médico. Esto es importante.  SOLICITE ATENCIÓN MÉDICA  SI:  · Tiene los siguientes síntomas:  ? Secreción vaginal que tiene mal olor.  ? Dificultad para orinar.  ? Dolor al orinar.  ? Aumento o disminución repentinos de la frecuencia con que defeca.  ? Más enrojecimiento, hinchazón o dolor alrededor de la episiotomía o del desgarro vaginal.  ? Más secreción de líquido o sangre de la episiotomía o desgarro vaginal.  ? Pus o mal olor proveniente de la episiotomía o el desgarro vaginal.  ? Fiebre.  ? Erupción cutánea.  ? Poco interés o falta de interés en actividades que solían gustarle.  ? Dudas sobre su cuidado y el del bebé.  · Siente la episiotomía o el desgarro vaginal caliente al tacto.  · La episiotomía o el desgarro vaginal se está abriendo o no parece cicatrizar.  · Siente dolor en las mamas, o están duras o enrojecidas.  · Siente tristeza o preocupación de forma inusual.  · Siente náuseas o vomita.  · Elimina coágulos grandes por la vagina. Si expulsa un coágulo sanguíneo por la vagina, guárdelo para mostrárselo a su médico. No tire la cadena sin que el médico examine el coágulo antes.  · Orina más de lo habitual.  · Se siente mareada o se desmaya.  · No ha amamantado para nada y no ha tenido un período menstrual durante 12 semanas después del parto.  · Dejó de amamantar al bebé y no ha tenido su período menstrual durante 12 semanas después de dejar de amamantar.    SOLICITE ATENCIÓN MÉDICA DE INMEDIATO SI:  · Tiene los siguientes síntomas:  ? Dolor que no desaparece o no mejora con el medicamento.  ? Dolor en el pecho.  ? Dificultad para respirar.  ? Visión borrosa o manchas en la vista.  ? Pensamientos de autolesionarse o lesionar al bebé.  · Comienza a sentir dolor en el abdomen o en una de las piernas.  · Dolor de cabeza intenso.  · Se desmaya.  · Tiene una hemorragia tan intensa de la vagina que empapa dos toallitas sanitarias en una hora.    Esta información no tiene como fin reemplazar el consejo del médico. Asegúrese de hacerle al médico cualquier  pregunta que tenga.  Document Released: 06/16/2005 Document Revised: 10/08/2015 Document Reviewed: 07/01/2015  Elsevier Interactive Patient Education © 2017 Elsevier Inc.

## 2016-12-21 NOTE — Lactation Note (Addendum)
This note was copied from a baby's chart. Lactation Consultation Note  Patient Name: Rebecca Ibarra [id number]'XToday's Date: 12/21/2016 Reason for consult: Follow-up assessment  Baby is 4037 hours old.  LC reviewed doc flow and updated.  Spoke with Dr. Jonathon Ibarra in regards to the baby's doc flow reports and low Latch scores.  Other wise the volume of supplement was good , voids . Stools QS.  Mom is using a #24 NS , LC rechecked size and baby able to accommodate it when latched.  Also to take a bottle well. Baby fed for 15 mins with multiple swallows, increased with breast compressions.  LC recommended due the baby being a early term infant - to breast feed 1st 15 -20 mins with #24 NS , instilling  EBM or formula into the top and feed , after wards supplement 30 ml and post pump for 10 -15 mins.  [facility] $30.oo cash. LC explained where to return the DEBP.  Mom able to apply the #24 NS , LC showed her how to use the curved tip syringe and instill ENM or formula in the  Top. Sore nipple and engorgement prevention and tx reviewed.  LC stressed the importance of extra pumping after feeding to enhance milk coming  In.  LC offered an LC O/P appt. And [facility]. LC stressed if WIC was unable to  Give her an appt. To call [facility] office ( # phone given ).  Mother informed of post-discharge support and given phone number to the lactation department, including services for phone call assistance; out-patient appointments; and breastfeeding support group. List of other breastfeeding resources in the community given in the handout. Encouraged mother to call for problems or concerns related to breastfeeding.  Spanish interpreter until 3pm was Hess CorporationMarta Ibarra and after 3pm was Rebecca Ibarra - present for Spanish      Maternal Data Has patient been taught Hand Expression?: Yes  Feeding - Use of a #24 NS with LC assist  Feeding Type: Breast  Fed Length of feed: 15 min  LATCH Score/Interventions Latch: Grasps breast easily, tongue down, lips flanged, rhythmical sucking. Intervention(s): Skin to skin;Teach feeding cues;Waking techniques Intervention(s): Adjust position;Assist with latch;Breast massage;Breast compression  Audible Swallowing: Spontaneous and intermittent Intervention(s): Skin to skin  Type of Nipple: Everted at rest and after stimulation Intervention(s): Double electric pump  Comfort (Breast/Nipple): Soft / non-tender     Hold (Positioning): Assistance needed to correctly position infant at breast and maintain latch. Intervention(s): Breastfeeding basics reviewed;Support Pillows;Position options;Skin to skin  LATCH Score: 9  Lactation Tools Discussed/Used Tools: Pump;Nipple Dorris CarnesShields;Shells;Flanges Nipple shield size: 24 (LC checked fit , and had mom  apply it ) Flange Size: 27 Shell Type: Inverted Breast pump type: Double-Electric Breast Pump WIC Program: Yes Pump Review: Setup, frequency, and cleaning;Milk Storage Initiated by:: MAI  (reviewed and encouraged after 5-6 feedings  a day )   Consult Status Consult Status: Complete (declined LC O/P appt. and [facility] ) Date: 12/21/16 Follow-up type: Other (comment)    Rebecca SprangMargaret Ann Aleasha Ibarra 12/21/2016, 4:15 PM

## 2016-12-21 NOTE — Discharge Summary (Signed)
OB Discharge Summary     Patient Name: Rebecca Ibarra DOB: 11-12-1980 MRN: [id number]  Date of admission: 12/19/2016 Delivering MD: [name]   Date of discharge: 12/21/2016  Admitting diagnosis: INDUCTION Intrauterine pregnancy: [redacted]w[redacted]d     Secondary diagnosis:  Active Problems:   Cholestasis during pregnancy  Additional problems: none     Discharge diagnosis: Term Pregnancy Delivered and cholestasis                                                                                                Post partum procedures:none  Augmentation: Cytotec and Foley Balloon  Complications: None  Hospital course:  Induction of Labor With Vaginal Delivery   36 y.o. yo [id number] at [redacted]w[redacted]d was admitted to the hospital 12/19/2016 for induction of labor.  Indication for induction: Cholestasis of pregnancy.  Patient had an uncomplicated labor course as follows: Membrane Rupture Time/Date: 3:57 PM ,12/19/2016   Intrapartum Procedures: Episiotomy: None [1]                                         Lacerations:  None [1]  Patient had delivery of a Viable infant.  Information for the patient's newborn:  Maura, Braaten [[id number]]  Delivery Method: Vag-Spont   12/20/2016  Details of delivery can be found in separate delivery note.  Patient had a routine postpartum course. Patient is discharged home 12/21/16.  Physical exam  Vitals:   12/20/16 0500 12/20/16 1620 12/21/16 0100 12/21/16 0620  BP: (!) 97/51 (!) 86/59 (!) 98/59 96/64  Pulse: 86 83 76 65  Resp: 18 18 17 16   Temp: 98.5 F (36.9 C) 98 F (36.7 C) 98 F (36.7 C) 97.7 F (36.5 C)  TempSrc: Oral Oral Oral Oral  SpO2:  99% 98% 100%  Weight:      Height:       General: alert, cooperative and no distress Lochia: appropriate Uterine Fundus: firm Incision: N/A DVT Evaluation: No evidence of DVT seen on physical exam. Labs: Lab Results  Component Value Date   WBC 8.7 12/19/2016   HGB 11.4 (L)  12/19/2016   HCT 33.8 (L) 12/19/2016   MCV 80.1 12/19/2016   PLT 305 12/19/2016   CMP Latest Ref Rng & Units 12/19/2016  Glucose 65 - 99 mg/dL 130(Q)  BUN 6 - 20 mg/dL 7  Creatinine 6.57 - 8.46 mg/dL 9.62  Sodium 952 - 841 mmol/L 133(L)  Potassium 3.5 - 5.1 mmol/L 3.6  Chloride 101 - 111 mmol/L 105  CO2 22 - 32 mmol/L 19(L)  Calcium 8.9 - 10.3 mg/dL 3.2(G)  Total Protein 6.5 - 8.1 g/dL 6.3(L)  Total Bilirubin 0.3 - 1.2 mg/dL 0.3  Alkaline Phos 38 - 126 U/L 161(H)  AST 15 - 41 U/L 23  ALT 14 - 54 U/L 16    Discharge instruction: per After Visit Summary and "Baby and Me Booklet".  After visit meds:  Allergies as of 12/21/2016   No Known Allergies  Medication List    STOP taking these medications   ASPIRIN PO   hydrOXYzine 25 MG tablet Commonly known as:  ATARAX/VISTARIL   metoCLOPramide 10 MG tablet Commonly known as:  REGLAN   omeprazole 20 MG capsule Commonly known as:  PRILOSEC   ursodiol 500 MG tablet Commonly known as:  ACTIGALL     TAKE these medications   acetaminophen 325 MG tablet Commonly known as:  TYLENOL Take 2 tablets (650 mg total) by mouth every 4 (four) hours as needed (for pain scale < 4). What changed:  when to take this  reasons to take this   famotidine 20 MG tablet Commonly known as:  PEPCID Take 1 tablet (20 mg total) by mouth 2 (two) times daily.   ibuprofen 600 MG tablet Commonly known as:  ADVIL,MOTRIN Take 1 tablet (600 mg total) by mouth every 6 (six) hours.   prenatal multivitamin Tabs tablet Take 1 tablet by mouth daily at 12 noon.       Diet: routine diet  Activity: Advance as tolerated. Pelvic rest for 6 weeks.   Outpatient follow up:6 weeks Follow up Appt:Future Appointments Date Time Provider Department Center  01/20/2017 3:20 PM Katrinka BlazingSmith, [place] [facility]   Follow up Visit:No Follow-up on file.  Postpartum contraception: Nexplanon  Newborn Data: Live born female  Birth Weight: 6 lb 3.5 oz (2821  g) APGAR: 8, 9  Baby Feeding: Bottle and Breast Disposition:home with mother   12/21/2016 Ernestina PennaNicholas Bethene Hankinson, MD

## 2017-01-20 ENCOUNTER — Encounter: Payer: Self-pay | Admitting: Advanced Practice Midwife

## 2017-01-20 ENCOUNTER — Ambulatory Visit (INDEPENDENT_AMBULATORY_CARE_PROVIDER_SITE_OTHER): Payer: Self-pay | Admitting: Advanced Practice Midwife

## 2017-01-20 DIAGNOSIS — Z3009 Encounter for other general counseling and advice on contraception: Secondary | ICD-10-CM

## 2017-01-20 NOTE — Progress Notes (Signed)
Subjective:     Rebecca PlummerMaria Herminia [name] is a 36 y.o. female who presents for a postpartum visit. She is 4 weeks postpartum following a spontaneous vaginal delivery. I have fully reviewed the prenatal and intrapartum course. The delivery was at 37 gestational weeks. Outcome: spontaneous vaginal delivery. Anesthesia: epidural. Postpartum course has been Uncomplicated. Baby's course has been uncomplicated. Baby is feeding by bottle - Similac soy. Bleeding no bleeding. Bowel function is normal. Bladder function is normal. Patient is not sexually active. Contraception method is Nexplanon. Postpartum depression screening: negative.  The following portions of the patient's history were reviewed and updated as appropriate: allergies, current medications, past family history, past medical history, past social history, past surgical history and problem list.  Phone interpreter used.   Review of Systems Pertinent items are noted in HPI.   Objective:    BP 100/60   Pulse 74   Wt 166 lb (75.3 kg)   Breastfeeding? No   BMI 30.36 kg/m   General:  alert, cooperative, appears stated age and no distress   Breasts:  negative  Lungs: clear to auscultation bilaterally  Heart:  regular rate and rhythm, S1, S2 normal, no murmur, click, rub or gallop  Abdomen: soft, non-tender; bowel sounds normal; no masses,  no organomegaly   Vulva:  not evaluated  Vagina: not evaluated  Cervix:  not evaluated   Corpus: not examined  Adnexa:  not evaluated  Rectal Exam: Not performed.        Assessment:     Nml postpartum exam. Pap smear not done at today's visit.   Plan:    1. Contraception: Nexplanon--Will call [facility] in am to schedule insertion. Have better discounts. Abstain until insertion.  2. Pap due 2020 3. Follow up in: 1 year or as needed.    Katrinka BlazingSmith, [place], [place] 01/20/2017 5:06 PM

## 2017-01-20 NOTE — Patient Instructions (Signed)
Etonogestrel implant Qu es este medicamento? El ETONOGESTREL es un dispositivo anticonceptivo (control de la natalidad). Se usa para evitar los embarazos. Se puede usar hasta por 3 aos. Este medicamento puede ser utilizado para otros usos; si tiene alguna pregunta consulte con su proveedor de atencin mdica o con su farmacutico. MARCAS COMUNES: Implanon, Nexplanon Qu le debo informar a mi profesional de la salud antes de tomar este medicamento? Necesita saber si usted presenta alguno de los siguientes problemas o situaciones: sangrado vaginal anormal enfermedad vascular o cogulos sanguneos cncer de mama, cervical, heptico depresin diabetes enfermedad de la vescula biliar dolores de cabeza enfermedad cardiaca o ataque cardiaco reciente alta presin sangunea alto nivel de colesterol enfermedad renal enfermedad heptica convulsiones fuma tabaco una reaccin alrgica o inusual al etonogestrel, otras hormonas, anestsicos o antispticos, medicamentos, alimentos, colorantes o conservantes si est embarazada o buscando quedar embarazada si est amamantando a un beb Cmo debo utilizar este medicamento? Un profesional de la salud inserta este dispositivo justo debajo de la piel en la parte interior del brazo. Hable con su pediatra para informarse acerca del uso de este medicamento en nios. Puede requerir atencin especial. Sobredosis: Pngase en contacto inmediatamente con un centro toxicolgico o una sala de urgencia si usted cree que haya tomado demasiado medicamento. ATENCIN: Este medicamento es solo para usted. No comparta este medicamento con nadie. Qu sucede si me olvido de una dosis? No se aplica en este caso. Qu puede interactuar con este medicamento? No tome este medicamento con ninguno de los siguientes frmacos: amprenavir bosentano fosamprenavir Este medicamento tambin podra interactuar con los siguientes medicamentos: barbitricos para inducir el sueo o para el  tratamiento de convulsiones ciertos medicamentos para infecciones micticas, tales como itraconazol y ketoconazol jugo de toronja griseofulvina medicamentos para tratar convulsiones, tales como carbamazepina, felbamato, oxcarbazepina, fenitona, topiramato modafinilo fenilbutazona rifampicina rufinamida algunos medicamentos para tratar la infeccin por el VIH, tales como atazanavir, indinavir, lopinavir, nelfinavir, tipranavir, ritonavir hierba de [place] Puede ser que esta lista no menciona todas las posibles interacciones. Informe a su profesional de la salud de todos los productos a base de hierbas, medicamentos de venta libre o suplementos nutritivos que est tomando. Si usted fuma, consume bebidas alcohlicas o si utiliza drogas ilegales, indqueselo tambin a su profesional de la salud. Algunas sustancias pueden interactuar con su medicamento. A qu debo estar atento al usar este medicamento? Este producto no protege contra la infeccin por el VIH (SIDA) u otras enfermedades de transmisin sexual. Usted debe sentir el implante al presionar con las yemas de los dedos sobre la piel donde se insert. Contacte a su mdico si no se siente el implante y usa un mtodo anticonceptivo no hormonal (como el condn) hasta que el mdico confirma que el implante est en su lugar. Si siente que el implante puede haber roto o doblado en su brazo, pngase en contacto con su proveedor de atencin mdica. Qu efectos secundarios puedo tener al utilizar este medicamento? Efectos secundarios que debe informar a su mdico o a su profesional de la salud tan pronto como sea posible: reacciones alrgicas, como erupcin cutnea, picazn o urticarias, e hinchazn de la cara, los labios o la lengua bultos en las mamas cambios en las emociones o el estado de nimo estado de nimo deprimido sangrado menstrual intenso o prolongado dolor, irritacin, hinchazn o moretones en el lugar de la insercin cicatriz en el lugar de la  insercin signos de infeccin en el sitio de insercin tales como fiebre, y enrojecimiento de   la piel, dolor o secrecin signos de embarazo signos y sntomas de un cogulo sanguneo, tales como problemas respiratorios; cambios en la visin; dolor en el pecho; dolor de cabeza severo, repentino; dolor, hinchazn, calor en la pierna; dificultad para hablar; entumecimiento o debilidad repentina de la cara, el brazo o la pierna signos y sntomas de lesin al hgado, como orina amarilla oscura o marrn; sensacin general de estar enfermo o sntomas gripales; heces claras; prdida de apetito; nuseas; dolor en la regin abdominal superior derecha; cansancio o debilidad inusual; color amarillento de los ojos o la piel sangrado vaginal inusual, secrecin signos y sntomas de un derrame cerebral, tales como cambios en la visin; confusin; dificultad para hablar o entender; dolores de cabeza severos; entumecimiento o debilidad repentina de la cara, el brazo o la pierna; problemas al caminar; mareo; prdida del equilibrio o la coordinacin Efectos secundarios que generalmente no requieren atencin mdica (infrmelos a su mdico o a su profesional de la salud si persisten o si son molestos): acn dolor de espalda dolor en las mamas cambios de peso mareos sensacin general de estar enfermo o sntomas gripales dolor de cabeza sangrado menstrual irregular nuseas dolor de garganta irritacin o inflamacin vaginal Puede ser que esta lista no menciona todos los posibles efectos secundarios. Comunquese a su mdico por asesoramiento mdico sobre los efectos secundarios. Usted puede informar los efectos secundarios a la FDA por telfono al 1-800-FDA-1088. Dnde debo guardar mi medicina? Este medicamento se administra en hospitales o clnicas y no necesitar guardarlo en su domicilio. ATENCIN: Este folleto es un resumen. Puede ser que no cubra toda la posible informacin. Si usted tiene preguntas acerca de esta medicina,  consulte con su mdico, su farmacutico o su profesional de la salud.  2018 Elsevier/Gold Standard (2016-07-17 00:00:00)  

## 2017-01-22 ENCOUNTER — Encounter: Payer: Self-pay | Admitting: Advanced Practice Midwife

## 2017-03-27 ENCOUNTER — Ambulatory Visit (INDEPENDENT_AMBULATORY_CARE_PROVIDER_SITE_OTHER): Payer: Self-pay | Admitting: Internal Medicine

## 2017-03-27 ENCOUNTER — Encounter: Payer: Self-pay | Admitting: Internal Medicine

## 2017-03-27 VITALS — BP 118/78 | HR 80 | Resp 12 | Ht 62.5 in | Wt 177.0 lb

## 2017-03-27 DIAGNOSIS — K089 Disorder of teeth and supporting structures, unspecified: Secondary | ICD-10-CM

## 2017-03-27 DIAGNOSIS — G8929 Other chronic pain: Secondary | ICD-10-CM

## 2017-03-27 NOTE — Progress Notes (Signed)
   Subjective:    Patient ID: Rebecca Ibarra, female    DOB: 1980/07/29, 36 y.o.   MRN: [id number]  HPI   Here to establish.  Has been here only with her son before.   Has soft white "balls"  In upper right gingiva.  Has had for 1 year.  At times is tender in the area.    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  Being [facility] through [facility]  Current Meds  Medication Sig  . famotidine (PEPCID) 20 MG tablet Take 1 tablet (20 mg total) by mouth 2 (two) times daily.  . Prenatal Vit-Fe Fumarate-FA (PRENATAL MULTIVITAMIN) TABS tablet Take 1 tablet by mouth daily at 12 noon.  Marland Kitchen RIFAMPIN PO Take by mouth 2 (two) times daily.    No Known Allergies    Review of Systems     Objective:   Physical Exam NAD HEENT:  PERRL, EOMI, TMs pearly gray.  Throat without injection.  Has gingival flap overlapping partially erupted posterior molar in right upper jaw.  NT, no erythema or fluctuance Neck:  Supple, No adenopathy, no thyromegaly Chest:  CTA CV:  RRR without murmur or rub, radial pulses normal and equal        Assessment & Plan:  Probably impacted molar with intermittent surrounging inflammation.  Discussed cleaning under flap of gingiva with toothbrush and rubber pick or Waterpick.  Gargle thereafter with Listerine. Dental referral.

## 2017-08-19 ENCOUNTER — Encounter: Payer: Self-pay | Admitting: *Deleted

## 2017-09-11 IMAGING — US US MFM OB DETAIL+14 WK
1 series · 14 of 28 positions shown · non-contrast
Comparison: none

[Series 1: us mfm ob detail+14 wk · 14 of 68 slices shown]
[im 3/68]
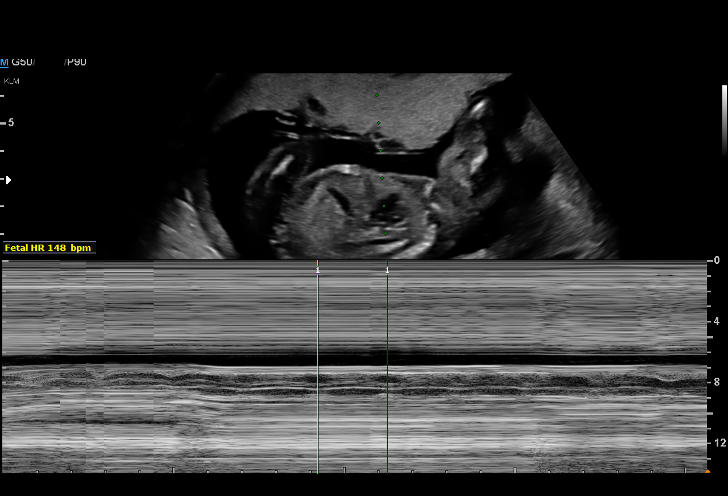
[im 8/68]
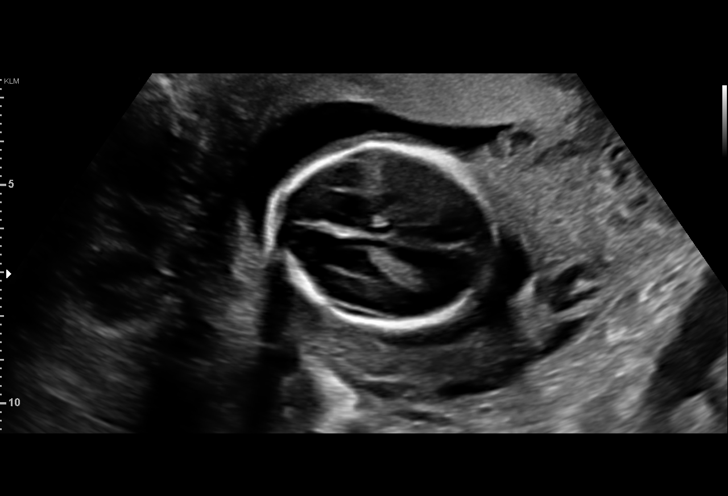
[im 13/68]
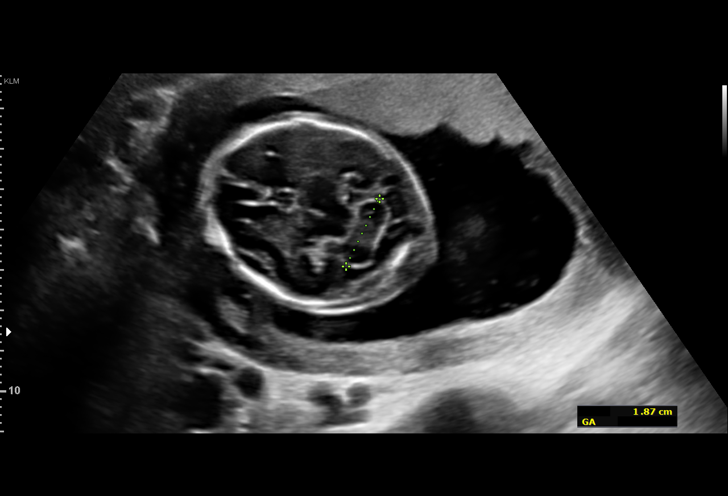
[im 18/68]
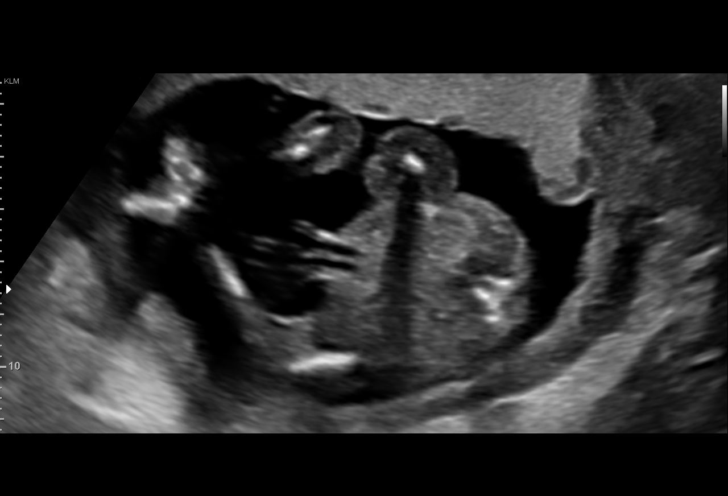
[im 23/68]
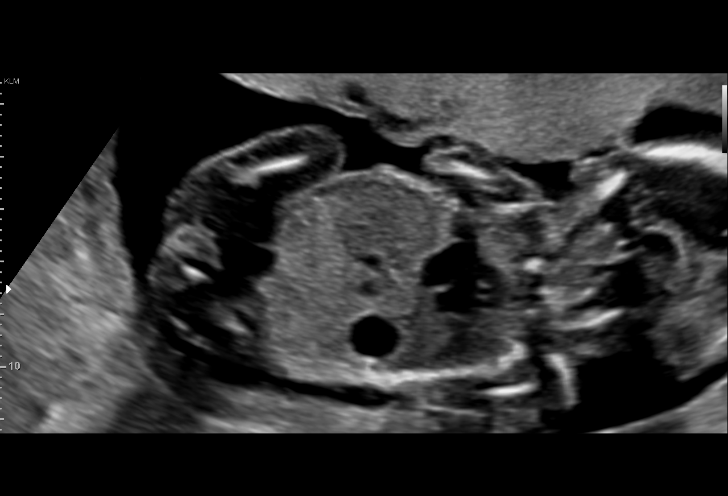
[im 28/68]
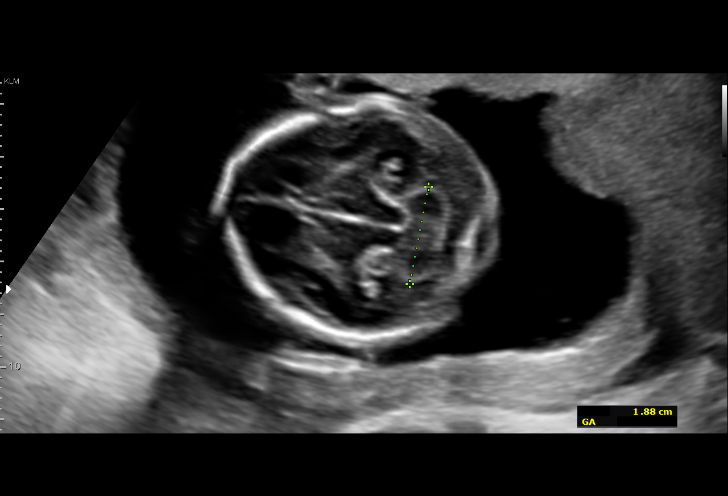
[im 33/68]
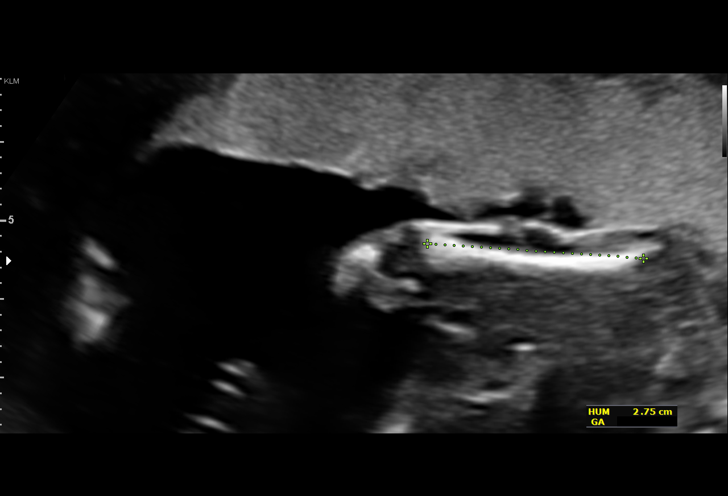
[im 38/68]
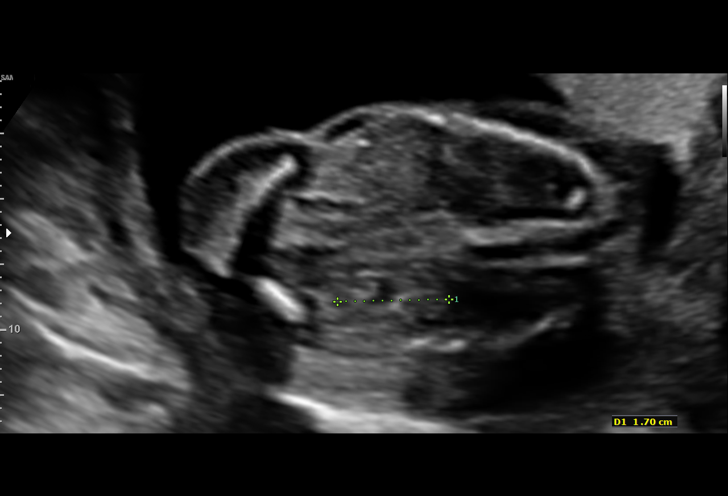
[im 43/68]
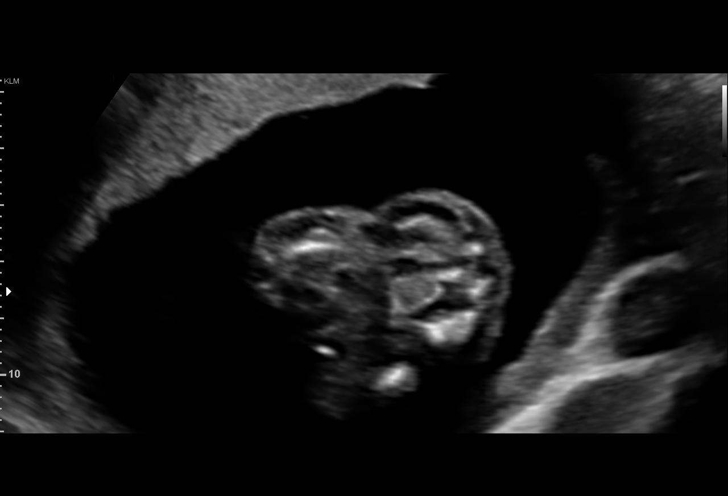
[im 48/68]
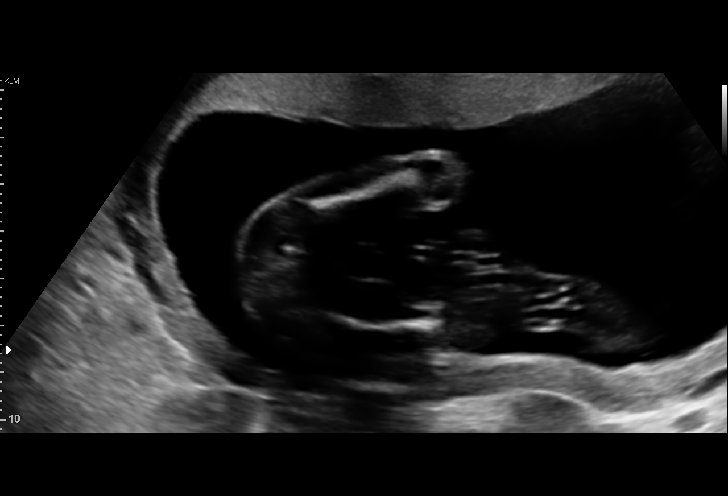
[im 53/68]
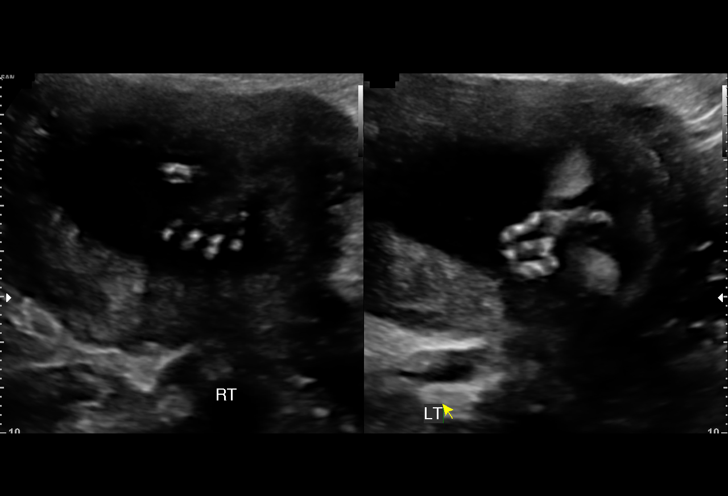
[im 58/68]
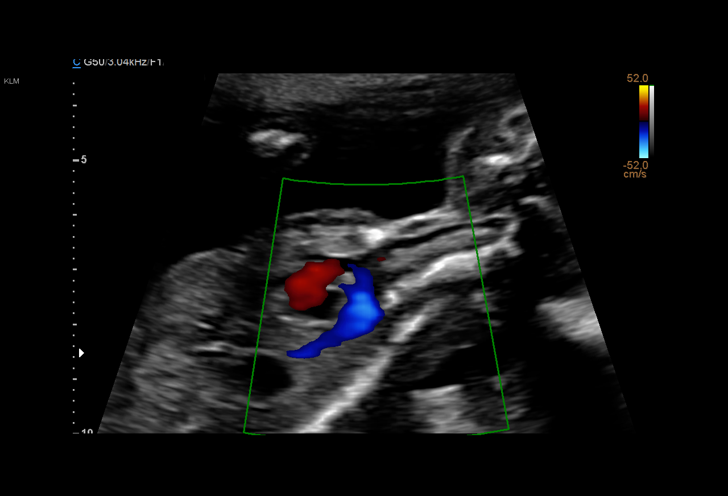
[im 63/68]
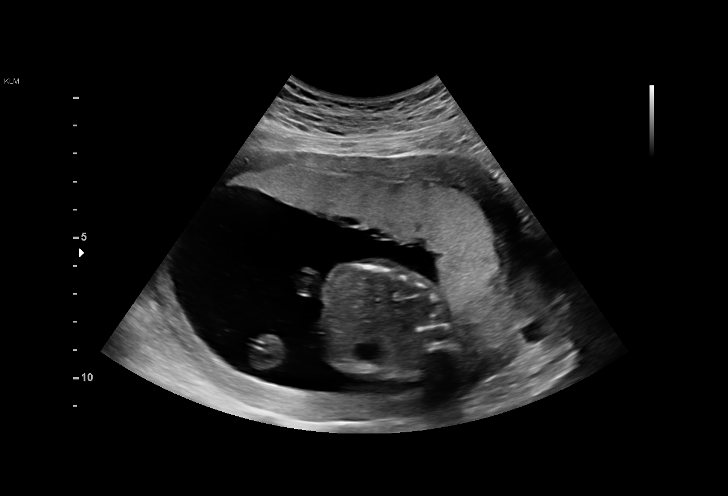
[im 68/68]
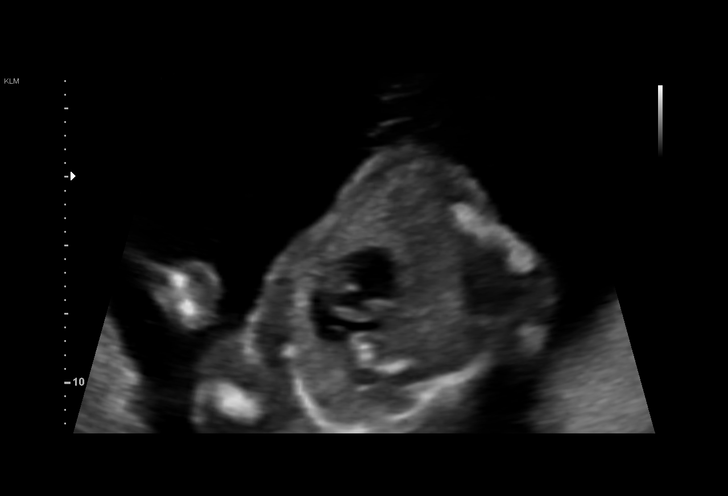

[14 of 28 positions shown; findings below may reference images not displayed]

ROSA ISELA

1  YEDRA VITA             [id number]      [id number]     [id number]
Indications

18 weeks gestation of pregnancy
Advanced maternal age multigravida 35+,
second trimester
Poor obstetric history: Previous IUFD (@ 40
weeks)
Poor obstetrical history (low birth weight
babies)
Encounter for antenatal screening for
malformations
OB History

Gravidity:    6         Term:   4        Prem:   0        SAB:   1
TOP:          0       Ectopic:  0        Living: 3
Fetal Evaluation

Num Of Fetuses:     1
Fetal Heart         148
Rate(bpm):
Cardiac Activity:   Observed
Presentation:       Cephalic
Placenta:           Anterior, above cervical os
P. Cord Insertion:  Visualized, central

Amniotic Fluid
AFI FV:      Subjectively within normal limits

Largest Pocket(cm)
5.9
Biometry

BPD:      42.2  mm     G. Age:  18w 5d         82  %    CI:        75.59   %   70 - 86
FL/HC:      16.0   %   15.8 - 18
HC:      153.9  mm     G. Age:  18w 3d         60  %    HC/AC:      1.12       1.07 -
AC:      136.9  mm     G. Age:  19w 1d         81  %    FL/BPD:     58.5   %
FL:       24.7  mm     G. Age:  17w 3d         25  %    FL/AC:      18.0   %   20 - 24
HUM:      26.5  mm     G. Age:  18w 2d         67  %
CER:      18.7  mm     G. Age:  18w 2d         61  %

Est. FW:     236  gm      0 lb 8 oz     55  %
Gestational Age

LMP:           18w 0d       Date:   04/03/16                 EDD:   01/08/17
U/S Today:     18w 3d                                        EDD:   01/05/17
Best:          18w 0d    Det. By:   LMP  (04/03/16)          EDD:   01/08/17
Anatomy

Cranium:               Appears normal         Aortic Arch:            Appears normal
Cavum:                 Appears normal         Ductal Arch:            Appears normal
Ventricles:            Appears normal         Diaphragm:              Appears normal
Choroid Plexus:        Appears normal         Stomach:                Appears normal, left
sided
Cerebellum:            Appears normal         Abdomen:                Appears normal
Posterior Fossa:       Appears normal         Abdominal Wall:         Appears nml (cord
insert, abd wall)
Nuchal Fold:           Appears normal         Cord Vessels:           Appears normal (3
vessel cord)
Face:                  Appears normal         Kidneys:                Appear normal
(orbits and profile)
Lips:                  Appears normal         Bladder:                Appears normal
Heart:                 Not well visualized    Spine:                  Appears normal
RVOT:                  Appears normal         Upper Extremities:      Appears normal
LVOT:                  Not well visualized    Lower Extremities:      Appears normal

Other:  Fetus appears to be a male. Heels and 5th digit visualized.
Cervix Uterus Adnexa

Cervix
Length:            4.2  cm.
Normal appearance by transabdominal scan.
Impression

Singleton intrauterine pregnancy at 18 weeks 0 days
gestation with fetal cardiac activity
Cephalic presentation
Anterior placenta without evidence of previa
Normal appearing fetal growth and amniotic fluid volume
No apparent birth defects noted although not all fetal
structures well visualized during the ultrasound today
Normal appearing cervical length
Recommendations

Recommend follow-up ultrasound examination in 
 2 weeks
for completion of fetal anatomic survey

## 2021-04-22 ENCOUNTER — Other Ambulatory Visit: Payer: Self-pay | Admitting: Obstetrics & Gynecology

## 2021-04-22 ENCOUNTER — Other Ambulatory Visit: Payer: Self-pay | Admitting: Obstetrics and Gynecology

## 2021-04-22 DIAGNOSIS — Z1231 Encounter for screening mammogram for malignant neoplasm of breast: Secondary | ICD-10-CM

## 2021-05-16 ENCOUNTER — Ambulatory Visit
Admission: RE | Admit: 2021-05-16 | Discharge: 2021-05-16 | Disposition: A | Discharge status: Home / Self Care | Payer: No Typology Code available for payment source | Source: Ambulatory Visit | Attending: Obstetrics and Gynecology | Admitting: Obstetrics and Gynecology

## 2021-05-16 ENCOUNTER — Other Ambulatory Visit: Payer: Self-pay

## 2021-05-16 ENCOUNTER — Ambulatory Visit: Payer: Self-pay

## 2021-05-16 DIAGNOSIS — Z1231 Encounter for screening mammogram for malignant neoplasm of breast: Secondary | ICD-10-CM

## 2021-05-20 ENCOUNTER — Other Ambulatory Visit: Payer: Self-pay | Admitting: Obstetrics and Gynecology

## 2021-05-20 DIAGNOSIS — R928 Other abnormal and inconclusive findings on diagnostic imaging of breast: Secondary | ICD-10-CM

## 2021-08-15 ENCOUNTER — Encounter (INDEPENDENT_AMBULATORY_CARE_PROVIDER_SITE_OTHER): Payer: Self-pay

## 2021-08-15 ENCOUNTER — Other Ambulatory Visit: Payer: Self-pay

## 2021-08-15 ENCOUNTER — Ambulatory Visit
Admission: RE | Admit: 2021-08-15 | Discharge: 2021-08-15 | Disposition: A | Discharge status: Home / Self Care | Payer: No Typology Code available for payment source | Source: Ambulatory Visit | Attending: Obstetrics and Gynecology | Admitting: Obstetrics and Gynecology

## 2021-08-15 ENCOUNTER — Ambulatory Visit: Payer: Self-pay | Admitting: *Deleted

## 2021-08-15 VITALS — BP 114/72 | Wt 183.2 lb

## 2021-08-15 DIAGNOSIS — R928 Other abnormal and inconclusive findings on diagnostic imaging of breast: Secondary | ICD-10-CM

## 2021-08-15 DIAGNOSIS — Z1239 Encounter for other screening for malignant neoplasm of breast: Secondary | ICD-10-CM

## 2021-08-15 NOTE — Progress Notes (Addendum)
[name] is a 41 y.o. female who presents to [facility] clinic today with complaint of bilateral spontaneous clear nipple discharge x 2.5-3 years. Patient had a screening mammogram completed 05/16/2021 that additional imaging bilateral breasts recommended for follow-up.    Pap Smear: Pap smear not completed today. Last Pap smear was 02/07/2020 at the [facility] clinic and was normal with negative HPV. Per patient has no history of an abnormal Pap smear. Last Pap smear result is not available in Epic. Last Pap smear result will be scanned into Epic.   Physical exam: Breasts Breasts symmetrical. No skin abnormalities bilateral breasts. Bilateral nipple retraction observed on exam that per patient is normal for her. No nipple discharge bilateral breasts. Unable to express any nipple discharge from bilateral breasts on exam. No lymphadenopathy. No lumps palpated bilateral breasts. No complaints of pain or tenderness on exam.  MM 3D SCREEN BREAST BILATERAL  Result Date: 05/17/2021 CLINICAL DATA:  Screening. EXAM: DIGITAL SCREENING BILATERAL MAMMOGRAM WITH TOMOSYNTHESIS AND CAD TECHNIQUE: Bilateral screening digital craniocaudal and mediolateral oblique mammograms were obtained. Bilateral screening digital breast tomosynthesis was performed. The images were evaluated with computer-aided detection. COMPARISON:  Previous exam(s). ACR Breast Density Category c: The breast tissue is heterogeneously dense, which may obscure small masses. FINDINGS: In the right breast distortion requires further evaluation. In the left breast mass requires further evaluation. IMPRESSION: Further evaluation is suggested for possible distortion in the right breast. Further evaluation is suggested for possible mass in the left breast. RECOMMENDATION: Diagnostic mammogram and possibly ultrasound of both breasts. (Code:FI-B-89M) The patient will be contacted regarding the findings, and  additional imaging will be scheduled. BI-RADS CATEGORY  0: Incomplete. Need additional imaging evaluation and/or prior mammograms for comparison. Electronically Signed   By: Ted Mcalpine M.D.   On: 05/17/2021 16:53       Pelvic/Bimanual Pap is not indicated today per BCCCP guidelines.   Smoking History: Patient has never smoked.   Patient Navigation: Patient education provided. Access to services provided for patient through [facility] program. Spanish interpreter Natale Lay from [facility] provided.    Breast and Cervical Cancer Risk Assessment: Patient does not have family history of breast cancer, known genetic mutations, or radiation treatment to the chest before age 66. Patient does not have history of cervical dysplasia, immunocompromised, or DES exposure in-utero.  Risk Assessment     Risk Scores       08/15/2021   Last edited by: Narda Rutherford, LPN   5-year risk: 0.3 %   Lifetime risk: 4.7 %            A: BCCCP exam without pap smear Complaint of bilateral breast discharge.  P: Referred patient to the [facility] for a diagnostic mammogram per recommendation. Appointment scheduled Thursday, August 15, 2021 at 1240.  Priscille Heidelberg, RN 08/15/2021 10:56 AM

## 2021-08-15 NOTE — Patient Instructions (Addendum)
Explained breast self awareness with Lannie Fields. Patient did not need a Pap smear today due to last Pap smear and HPV typing was 02/07/2020. Let her know BCCCP will cover Pap smears and HPV typing every 5 years unless has a history of abnormal Pap smears. Referred patient to the [facility] for a diagnostic mammogram per recommendation. Appointment scheduled Thursday, August 15, 2021 at 1240. Patient aware of appointment and will be there. Robinette Haines Katherine Basset verbalized understanding.  Henning Ehle, Arvil Chaco, RN 10:56 AM

## 2022-10-13 ENCOUNTER — Telehealth: Payer: Self-pay

## 2022-10-13 NOTE — Telephone Encounter (Signed)
Telephoned patient at mobile number using interpreter# [id number]. Left a voice message with BCCCP contact information.

## 2022-10-28 ENCOUNTER — Other Ambulatory Visit: Payer: Self-pay

## 2022-10-28 DIAGNOSIS — Z1231 Encounter for screening mammogram for malignant neoplasm of breast: Secondary | ICD-10-CM

## 2022-10-30 ENCOUNTER — Ambulatory Visit
Admission: RE | Admit: 2022-10-30 | Discharge: 2022-10-30 | Disposition: A | Discharge status: Home / Self Care | Payer: No Typology Code available for payment source | Source: Ambulatory Visit | Attending: Obstetrics and Gynecology | Admitting: Obstetrics and Gynecology

## 2022-10-30 ENCOUNTER — Ambulatory Visit: Payer: Self-pay | Admitting: Hematology and Oncology

## 2022-10-30 VITALS — BP 116/80 | Wt 185.0 lb

## 2022-10-30 DIAGNOSIS — Z1231 Encounter for screening mammogram for malignant neoplasm of breast: Secondary | ICD-10-CM

## 2022-10-30 NOTE — Patient Instructions (Signed)
Taught Rebecca Ibarra about self breast awareness and gave educational materials to take home. Patient did not need a Pap smear today due to last Pap smear was in 02/07/2020 per patient.Let her know BCCCP will cover Pap smears every 3 years unless has a history of abnormal Pap smears. Referred patient to the [facility] for screening mammogram. Appointment scheduled for 10/30/22. Patient aware of appointment and will be there. Let patient know will follow up with her within the next couple weeks with results. Rebecca Ibarra verbalized understanding.  Pascal Lux, NP 9:47 AM

## 2022-10-30 NOTE — Progress Notes (Signed)
[name] is a 42 y.o. female who presents to [facility] clinic today with no complaints.    Pap Smear: Pap not smear completed today. Last Pap smear was 02/07/2020 and was normal. Per patient has no history of an abnormal Pap smear. Last Pap smear result is available in Epic. Will repeat in 2026   Physical exam: Breasts Breasts symmetrical. No skin abnormalities bilateral breasts. No nipple retraction bilateral breasts. No nipple discharge bilateral breasts. No lymphadenopathy. No lumps palpated bilateral breasts. MS DIGITAL DIAG TOMO BILAT  Result Date: 08/15/2021 CLINICAL DATA:  The patient was called back for possible right breast distortion and a left breast mass EXAM: DIGITAL DIAGNOSTIC BILATERAL MAMMOGRAM WITH TOMOSYNTHESIS AND CAD; ULTRASOUND RIGHT BREAST LIMITED; ULTRASOUND LEFT BREAST LIMITED TECHNIQUE: Bilateral digital diagnostic mammography and breast tomosynthesis was performed. The images were evaluated with computer-aided detection.; Targeted ultrasound examination of the right breast was performed; Targeted ultrasound examination of the left breast was performed. COMPARISON:  Previous exam(s). ACR Breast Density Category c: The breast tissue is heterogeneously dense, which may obscure small masses. FINDINGS: The possible right breast distortion resolves on additional imaging. The mass seen in the deep left breast on the MLO view near the level the nipple persists on today's imaging. Targeted ultrasound is performed, showing no abnormalities in the region of possible distortion seen on screening mammography. A cyst is seen in the left breast at 4 o'clock accounting for the mammographically identified mass. IMPRESSION: No mammographic or sonographic evidence of malignancy. RECOMMENDATION: Annual screening mammography. I have discussed the findings and recommendations with the patient. If applicable, a reminder letter will be sent to the patient regarding the next appointment.  BI-RADS CATEGORY  2: Benign. Electronically Signed   By: Gerome Sam III M.D.   On: 08/15/2021 14:04  MM 3D SCREEN BREAST BILATERAL  Result Date: 05/17/2021 CLINICAL DATA:  Screening. EXAM: DIGITAL SCREENING BILATERAL MAMMOGRAM WITH TOMOSYNTHESIS AND CAD TECHNIQUE: Bilateral screening digital craniocaudal and mediolateral oblique mammograms were obtained. Bilateral screening digital breast tomosynthesis was performed. The images were evaluated with computer-aided detection. COMPARISON:  Previous exam(s). ACR Breast Density Category c: The breast tissue is heterogeneously dense, which may obscure small masses. FINDINGS: In the right breast distortion requires further evaluation. In the left breast mass requires further evaluation. IMPRESSION: Further evaluation is suggested for possible distortion in the right breast. Further evaluation is suggested for possible mass in the left breast. RECOMMENDATION: Diagnostic mammogram and possibly ultrasound of both breasts. (Code:FI-B-2M) The patient will be contacted regarding the findings, and additional imaging will be scheduled. BI-RADS CATEGORY  0: Incomplete. Need additional imaging evaluation and/or prior mammograms for comparison. Electronically Signed   By: Ted Mcalpine M.D.   On: 05/17/2021 16:53          Pelvic/Bimanual Pap is not indicated today    Smoking History: Patient has never smoked and was not referred to quit line.    Patient Navigation: Patient education provided. Access to services provided for patient through [facility] program. [place] interpreter provided. No transportation provided   Colorectal Cancer Screening: Per patient has never had colonoscopy completed No complaints today.    Breast and Cervical Cancer Risk Assessment: Patient does not have family history of breast cancer, known genetic mutations, or radiation treatment to the chest before age 89. Patient does not have history of cervical dysplasia, immunocompromised,  or DES exposure in-utero.  Risk Scores as of 10/30/2022     [name]  5-year 0.43 %   Lifetime 6.58 %   This patient is Hisp[place]/Latina but has no documented birth country, so the Rogers model used data from [place] patients to calculate their risk score. Document a birth country in the Demographics activity for a more accurate score.         Last calculated by Caprice Red, CMA on 10/30/2022 at  8:51 AM        A: BCCCP exam without pap smear No complaints with benign exam.   P: Referred patient to the [facility] for a screening mammogram. Appointment scheduled 10/30/22.  Pascal Lux, NP 10/30/2022 9:45 AM

## 2023-07-21 ENCOUNTER — Other Ambulatory Visit: Payer: Self-pay | Admitting: Obstetrics & Gynecology

## 2023-07-21 DIAGNOSIS — Z1231 Encounter for screening mammogram for malignant neoplasm of breast: Secondary | ICD-10-CM

## 2023-11-12 ENCOUNTER — Ambulatory Visit
Admission: RE | Admit: 2023-11-12 | Discharge: 2023-11-12 | Disposition: A | Discharge status: Home / Self Care | Source: Ambulatory Visit | Attending: Obstetrics & Gynecology | Admitting: Obstetrics & Gynecology

## 2023-11-12 DIAGNOSIS — Z1231 Encounter for screening mammogram for malignant neoplasm of breast: Secondary | ICD-10-CM

## 2023-11-19 ENCOUNTER — Ambulatory Visit: Payer: Self-pay | Admitting: Obstetrics & Gynecology
